# Patient Record
Sex: Female | Born: 1960 | Race: Black or African American | Hispanic: No | Marital: Single | Smoking: Former smoker
Health system: Southern US, Community
[De-identification: ages and names within clinical notes are randomized; demographics above are authoritative.]

## PROBLEM LIST (undated history)

## (undated) DIAGNOSIS — M545 Low back pain, unspecified: Secondary | ICD-10-CM

## (undated) DIAGNOSIS — R0602 Shortness of breath: Secondary | ICD-10-CM

## (undated) DIAGNOSIS — M2550 Pain in unspecified joint: Secondary | ICD-10-CM

## (undated) DIAGNOSIS — R002 Palpitations: Secondary | ICD-10-CM

## (undated) DIAGNOSIS — I4891 Unspecified atrial fibrillation: Secondary | ICD-10-CM

## (undated) DIAGNOSIS — F419 Anxiety disorder, unspecified: Secondary | ICD-10-CM

## (undated) DIAGNOSIS — U071 COVID-19: Secondary | ICD-10-CM

## (undated) DIAGNOSIS — M25569 Pain in unspecified knee: Secondary | ICD-10-CM

## (undated) DIAGNOSIS — Z86711 Personal history of pulmonary embolism: Secondary | ICD-10-CM

## (undated) DIAGNOSIS — Z86718 Personal history of other venous thrombosis and embolism: Secondary | ICD-10-CM

## (undated) DIAGNOSIS — M549 Dorsalgia, unspecified: Secondary | ICD-10-CM

## (undated) DIAGNOSIS — E119 Type 2 diabetes mellitus without complications: Secondary | ICD-10-CM

## (undated) DIAGNOSIS — R079 Chest pain, unspecified: Secondary | ICD-10-CM

## (undated) DIAGNOSIS — R7303 Prediabetes: Secondary | ICD-10-CM

## (undated) DIAGNOSIS — I2699 Other pulmonary embolism without acute cor pulmonale: Secondary | ICD-10-CM

## (undated) HISTORY — DX: Low back pain, unspecified: M54.50

## (undated) HISTORY — DX: Unspecified atrial fibrillation: I48.91

## (undated) HISTORY — DX: Chest pain, unspecified: R07.9

## (undated) HISTORY — DX: Pain in unspecified knee: M25.569

## (undated) HISTORY — DX: Pain in unspecified joint: M25.50

## (undated) HISTORY — DX: Prediabetes: R73.03

## (undated) HISTORY — DX: Type 2 diabetes mellitus without complications: E11.9

## (undated) HISTORY — DX: Other pulmonary embolism without acute cor pulmonale: I26.99

## (undated) HISTORY — PX: ABDOMINAL HYSTERECTOMY: SHX81

## (undated) HISTORY — DX: Dorsalgia, unspecified: M54.9

## (undated) HISTORY — DX: Personal history of other venous thrombosis and embolism: Z86.718

## (undated) HISTORY — DX: COVID-19: U07.1

## (undated) HISTORY — DX: Anxiety disorder, unspecified: F41.9

## (undated) HISTORY — DX: Shortness of breath: R06.02

## (undated) HISTORY — DX: Palpitations: R00.2

## (undated) HISTORY — DX: Personal history of pulmonary embolism: Z86.711

---

## 2019-12-02 ENCOUNTER — Telehealth: Payer: Self-pay | Admitting: Cardiology

## 2019-12-02 NOTE — Telephone Encounter (Signed)
    Robin Landry from medical records calling back, she said she received a request to send medical records, notes and ekg to [place], however she needs authorization from pt. She doesn't have phone number so she asked once pt is in the office tomorrow to call her so she can get authorization and she will send the records to [place].

## 2019-12-03 ENCOUNTER — Other Ambulatory Visit: Payer: Self-pay | Admitting: Cardiology

## 2019-12-03 ENCOUNTER — Other Ambulatory Visit: Payer: Self-pay

## 2019-12-03 ENCOUNTER — Ambulatory Visit (INDEPENDENT_AMBULATORY_CARE_PROVIDER_SITE_OTHER): Payer: 59 | Admitting: Cardiology

## 2019-12-03 ENCOUNTER — Encounter: Payer: Self-pay | Admitting: Cardiology

## 2019-12-03 VITALS — BP 142/82 | HR 77 | Ht 64.5 in | Wt 278.6 lb

## 2019-12-03 DIAGNOSIS — I2699 Other pulmonary embolism without acute cor pulmonale: Secondary | ICD-10-CM | POA: Diagnosis not present

## 2019-12-03 DIAGNOSIS — R0683 Snoring: Secondary | ICD-10-CM

## 2019-12-03 DIAGNOSIS — I480 Paroxysmal atrial fibrillation: Secondary | ICD-10-CM

## 2019-12-03 DIAGNOSIS — R030 Elevated blood-pressure reading, without diagnosis of hypertension: Secondary | ICD-10-CM

## 2019-12-03 DIAGNOSIS — I4891 Unspecified atrial fibrillation: Secondary | ICD-10-CM

## 2019-12-03 NOTE — Patient Instructions (Signed)
Medication Instructions:  Your physician recommends that you continue on your current medications as directed. Please refer to the Current Medication list given to you today.  *If you need a refill on your cardiac medications before your next appointment, please call your pharmacy*   Lab Work: CMET, CBC, TSH today  If you have labs (blood work) drawn today and your tests are completely normal, you will receive your results only by: Marland Kitchen MyChart Message (if you have MyChart) OR . A paper copy in the mail If you have any lab test that is abnormal or we need to change your treatment, we will call you to review the results.   Testing/Procedures: Your physician has recommended that you have a sleep study. This test records several body functions during sleep, including: brain activity, eye movement, oxygen and carbon dioxide blood levels, heart rate and rhythm, breathing rate and rhythm, the flow of air through your mouth and nose, snoring, body muscle movements, and chest and belly movement.  Follow-Up: At [facility], you and your health needs are our priority.  As part of our continuing mission to provide you with exceptional heart care, we have created designated Provider Care Teams.  These Care Teams include your primary Cardiologist (physician) and Advanced Practice Providers (APPs -  Physician Assistants and Nurse Practitioners) who all work together to provide you with the care you need, when you need it.  We recommend signing up for the patient portal called "MyChart".  Sign up information is provided on this After Visit Summary.  MyChart is used to connect with patients for Virtual Visits (Telemedicine).  Patients are able to view lab/test results, encounter notes, upcoming appointments, etc.  Non-urgent messages can be sent to your provider as well.   To learn more about what you can do with MyChart, go to [email or web address].    Your next appointment:   6 month(s)  The format  for your next appointment:   In Person  Provider:   Epifanio Lesches, MD   Other Instructions Please check your blood pressure at home daily, write it down.  Call the office or send message via Mychart with the readings in 2 weeks for Dr. Bjorn Pippin to review.

## 2019-12-03 NOTE — Telephone Encounter (Signed)
Patient has new patient appointment to day with Dr. Bjorn Pippin, referred by PCP, Dr. Wynelle Link.  Records in proficient and records received from [facility] (hospital records).

## 2019-12-03 NOTE — Progress Notes (Signed)
Cardiology Office Note:    Date:  12/03/2019   ID:  Robin Landry, DOB [birth year], MRN [id number]  PCP:  Deatra James, MD  Cardiologist:  No primary care provider on file.  Electrophysiologist:  None   Referring MD: Deatra James, MD   Chief Complaint  Patient presents with   Atrial Fibrillation    History of Present Illness:    Robin Landry is a 59 y.o. female with a hx of morbid obesity, diabetes, PE after COVID-19 infection September 2021, atrial fibrillation who is referred by Dr. Wynelle Link for evaluation of atrial fibrillation.  She was hospitalized at [facility] in [place] 10/2019.  She had COVID-19 infection complicated by pulmonary embolism and atrial fibrillation.  CTPA showed right and left lower lobe segmental PEs.  She was started on diltiazem 240 mg daily and Eliquis 5 mg twice daily.  She had an episode of atrial fibrillation with RVR during admission, rate 118.  Was started on diltiazem drip and eventually switched to oral diltiazem.  Echocardiogram on 11/03/2019 showed EF 60-65%, normal RV size/function, no significant valvular disease.  Since discharge from the hospital, she reports she is feeling better.  She denies any chest pain or dyspnea.  Denies any lightheadedness, syncope, palpitations, or lower extremity edema.  Reports she does not exercise.  Does not check BP at home.  She smoked 0.5 pack/day x 15 years, quit at age 65.  No history of heart disease in her immediate family   No past medical history on file.    Current Medications: Current Meds  Medication Sig   diltiazem (CARDIZEM CD) 240 MG 24 hr capsule Take 240 mg by mouth daily.   ELIQUIS 5 MG TABS tablet Take 5 mg by mouth 2 (two) times daily.   metFORMIN (GLUCOPHAGE) 500 MG tablet Take 500 mg by mouth 2 (two) times daily.     Allergies:   Patient has no allergy information on record.   Social History   Socioeconomic History   Marital status: Single    Spouse name: Not on file    Number of children: Not on file   Years of education: Not on file   Highest education level: Not on file  Occupational History   Not on file  Tobacco Use   Smoking status: Former Smoker   Smokeless tobacco: Never Used  Substance and Sexual Activity   Alcohol use: Not on file   Drug use: Not on file   Sexual activity: Not on file  Other Topics Concern   Not on file  Social History Narrative   Not on file   Social Determinants of Health   Financial Resource Strain:    Difficulty of Paying Living Expenses: Not on file  Food Insecurity:    Worried About Running Out of Food in the Last Year: Not on file   Ran Out of Food in the Last Year: Not on file  Transportation Needs:    Lack of Transportation (Medical): Not on file   Lack of Transportation (Non-Medical): Not on file  Physical Activity:    Days of Exercise per Week: Not on file   Minutes of Exercise per Session: Not on file  Stress:    Feeling of Stress : Not on file  Social Connections:    Frequency of Communication with Friends and Family: Not on file   Frequency of Social Gatherings with Friends and Family: Not on file   Attends Religious Services: Not on file   Active Member  of Clubs or Organizations: Not on file   Attends Banker Meetings: Not on file   Marital Status: Not on file     Family History: No history of heart disease in her immediate family  ROS:   Please see the history of present illness.     All other systems reviewed and are negative.  EKGs/Labs/Other Studies Reviewed:    The following studies were reviewed today:   EKG:  EKG is  ordered today.  The ekg ordered today demonstrates normal sinus rhythm, rate 77, low voltage, nonspecific diffuse T wave flattening  Recent Labs: No results found for requested labs within last 8760 hours.  Recent Lipid Panel No results found for: CHOL, TRIG, HDL, CHOLHDL, VLDL, LDLCALC, LDLDIRECT  Physical Exam:    VS:  BP  (!) 142/82    Pulse 77    Ht 5' 4.5" (1.638 m)    Wt 278 lb 9.6 oz (126.4 kg)    SpO2 95%    BMI 47.08 kg/m     Wt Readings from Last 3 Encounters:  12/03/19 278 lb 9.6 oz (126.4 kg)     GEN:  in no acute distress HEENT: Normal NECK: No JVD; No carotid bruits LYMPHATICS: No lymphadenopathy CARDIAC:RRR, no murmurs, rubs, gallops RESPIRATORY:  Clear to auscultation without rales, wheezing or rhonchi  ABDOMEN: Soft, non-tender, non-distended MUSCULOSKELETAL:  No edema; No deformity  SKIN: Warm and dry NEUROLOGIC:  Alert and oriented x 3 PSYCHIATRIC:  Normal affect   ASSESSMENT:    1. Paroxysmal atrial fibrillation (HCC)   2. Acute pulmonary embolism without acute cor pulmonale, unspecified pulmonary embolism type (HCC)   3. Elevated BP without diagnosis of hypertension   4. Snoring    PLAN:    Paroxysmal atrial fibrillation: Diagnosed 10/2019 during admission for acute PE/COVID-19.  CHA2DS2-VASc score 2 (diabetes, female).  Echocardiogram on 11/03/2019 showed EF 60-65%, normal RV size/function, no significant valvular disease. -Continue Eliquis 5 mg twice daily -Continue diltiazem 240 mg daily -Sleep study -Check TSH, CMET, CBC  Acute pulmonary embolism: Diagnosed 10/2019.  No evidence of right heart strain on echocardiogram -Continue Eliquis 5 mg twice daily  T2DM: On Metformin  Elevated BP: Mildly elevated in clinic today, no history of hypertension.  On diltiazem for A. fib as above.  Asked patient to check BP twice daily for next week and call with results  Snoring: Concern for OSA, will check sleep study  RTC in 6 months  Medication Adjustments/Labs and Tests Ordered: Current medicines are reviewed at length with the patient today.  Concerns regarding medicines are outlined above.  Orders Placed This Encounter  Procedures   Comprehensive metabolic panel   CBC   TSH   EKG 12-Lead   Split night study   No orders of the defined types were placed in this  encounter.   Patient Instructions  Medication Instructions:  Your physician recommends that you continue on your current medications as directed. Please refer to the Current Medication list given to you today.  *If you need a refill on your cardiac medications before your next appointment, please call your pharmacy*   Lab Work: CMET, CBC, TSH today  If you have labs (blood work) drawn today and your tests are completely normal, you will receive your results only by:  MyChart Message (if you have MyChart) OR  A paper copy in the mail If you have any lab test that is abnormal or we need to change your treatment, we will call  you to review the results.   Testing/Procedures: Your physician has recommended that you have a sleep study. This test records several body functions during sleep, including: brain activity, eye movement, oxygen and carbon dioxide blood levels, heart rate and rhythm, breathing rate and rhythm, the flow of air through your mouth and nose, snoring, body muscle movements, and chest and belly movement.  Follow-Up: At [facility], you and your health needs are our priority.  As part of our continuing mission to provide you with exceptional heart care, we have created designated Provider Care Teams.  These Care Teams include your primary Cardiologist (physician) and Advanced Practice Providers (APPs -  Physician Assistants and Nurse Practitioners) who all work together to provide you with the care you need, when you need it.  We recommend signing up for the patient portal called "MyChart".  Sign up information is provided on this After Visit Summary.  MyChart is used to connect with patients for Virtual Visits (Telemedicine).  Patients are able to view lab/test results, encounter notes, upcoming appointments, etc.  Non-urgent messages can be sent to your provider as well.   To learn more about what you can do with MyChart, go to [email or web address].    Your next  appointment:   6 month(s)  The format for your next appointment:   In Person  Provider:   Epifanio Lesches, MD   Other Instructions Please check your blood pressure at home daily, write it down.  Call the office or send message via Mychart with the readings in 2 weeks for Dr. Bjorn Pippin to review.       Signed, Little Ishikawa, MD  12/03/2019 5:15 PM    [facility]

## 2019-12-04 ENCOUNTER — Telehealth: Payer: Self-pay | Admitting: *Deleted

## 2019-12-04 ENCOUNTER — Telehealth: Payer: Self-pay | Admitting: Cardiology

## 2019-12-04 MED ORDER — BLOOD PRESSURE CUFF MISC: 1.0000 [IU] | Freq: Once | 0 refills | Status: AC

## 2019-12-04 NOTE — Telephone Encounter (Signed)
PA request for HST submitted to [facility] via fax to [phone].

## 2019-12-04 NOTE — Telephone Encounter (Signed)
Spoke to patient she stated she needs a prescription to buy a B/P monitor.Stated her insurance will help pay if she has a prescription.Advised I will send message to Dr.Schumann's RN.

## 2019-12-04 NOTE — Telephone Encounter (Signed)
Patient wanted to know if Dr. Jerene Pitch could write her an rx for a BP machine. The patient uses [id number] - [place], [place] - [address] as her pharmacy

## 2019-12-04 NOTE — Telephone Encounter (Signed)
Left detailed message (ok per DPR)-rx sent electronically.  If this does not work, advised to call back and can print rx

## 2019-12-05 ENCOUNTER — Telehealth: Payer: Self-pay | Admitting: *Deleted

## 2019-12-05 ENCOUNTER — Telehealth: Payer: Self-pay | Admitting: Cardiology

## 2019-12-05 LAB — COMPREHENSIVE METABOLIC PANEL
ALT: 13 IU/L (ref 0–32)
AST: 17 IU/L (ref 0–40)
Albumin/Globulin Ratio: 1.1 — ABNORMAL LOW (ref 1.2–2.2)
Albumin: 4 g/dL (ref 3.8–4.9)
Alkaline Phosphatase: 100 IU/L (ref 44–121)
BUN/Creatinine Ratio: 15 (ref 9–23)
BUN: 13 mg/dL (ref 6–24)
Bilirubin Total: 0.3 mg/dL (ref 0.0–1.2)
CO2: 25 mmol/L (ref 20–29)
Calcium: 9.9 mg/dL (ref 8.7–10.2)
Chloride: 101 mmol/L (ref 96–106)
Creatinine, Ser: 0.86 mg/dL (ref 0.57–1.00)
GFR calc Af Amer: 87 mL/min/{1.73_m2} (ref >59)
GFR calc non Af Amer: 75 mL/min/{1.73_m2} (ref >59)
Globulin, Total: 3.6 g/dL (ref 1.5–4.5)
Glucose: 99 mg/dL (ref 65–99)
Potassium: 4.3 mmol/L (ref 3.5–5.2)
Sodium: 139 mmol/L (ref 134–144)
Total Protein: 7.6 g/dL (ref 6.0–8.5)

## 2019-12-05 LAB — CBC
Hematocrit: 36.7 % (ref 34.0–46.6)
Hemoglobin: 11.4 g/dL (ref 11.1–15.9)
MCH: 25.9 pg — ABNORMAL LOW (ref 26.6–33.0)
MCHC: 31.1 g/dL — ABNORMAL LOW (ref 31.5–35.7)
MCV: 83 fL (ref 79–97)
Platelets: 315 10*3/uL (ref 150–450)
RBC: 4.41 x10E6/uL (ref 3.77–5.28)
RDW: 15.7 % — ABNORMAL HIGH (ref 11.7–15.4)
WBC: 7.6 10*3/uL (ref 3.4–10.8)

## 2019-12-05 LAB — TSH: TSH: 1.54 u[IU]/mL (ref 0.450–4.500)

## 2019-12-05 NOTE — Telephone Encounter (Signed)
Returned call to patient, discussed lab results and patient verbalized understanding.

## 2019-12-05 NOTE — Telephone Encounter (Signed)
Patient is requesting to discuss results from lab work completed on 12/04/19. Please call.

## 2019-12-05 NOTE — Telephone Encounter (Signed)
Patient notified of HST appointment details. °

## 2019-12-17 ENCOUNTER — Ambulatory Visit: Payer: Self-pay | Admitting: Cardiovascular Disease

## 2019-12-18 ENCOUNTER — Encounter: Payer: Self-pay | Admitting: Internal Medicine

## 2019-12-18 ENCOUNTER — Other Ambulatory Visit: Payer: Self-pay

## 2019-12-18 ENCOUNTER — Ambulatory Visit (INDEPENDENT_AMBULATORY_CARE_PROVIDER_SITE_OTHER): Payer: 59 | Admitting: Internal Medicine

## 2019-12-18 ENCOUNTER — Institutional Professional Consult (permissible substitution): Payer: Self-pay | Admitting: Internal Medicine

## 2019-12-18 VITALS — BP 110/76 | HR 91 | Temp 97.1°F | Ht 64.5 in | Wt 275.0 lb

## 2019-12-18 DIAGNOSIS — U071 COVID-19: Secondary | ICD-10-CM | POA: Diagnosis not present

## 2019-12-18 DIAGNOSIS — I2699 Other pulmonary embolism without acute cor pulmonale: Secondary | ICD-10-CM | POA: Diagnosis not present

## 2019-12-18 NOTE — Progress Notes (Signed)
Robin Landry    [id number]    [birth year]  Primary Care Physician:Sun, Charise Carwin, MD  Referring Physician: Deatra James, MD [phone] [address] [place],  [place] [address] Reason for Consultation: covid follow up Date of Consultation: 12/18/2019  Chief complaint:   Chief Complaint  Patient presents with  . Consult    covid in August, hospitalized with blood clots and afib.     HPI: The patient is a 59 year old woman with an undiscovered past medical history who presents for follow-up after being hospitalized with presumed Covid infection.  She never had a positive test. But was presumed to have covid due to whole family contracting it from a nephew from school.  She was found to have acute bilateral pulmonary emboli as well as paroxysmal atrial fibrillation.  She is started on Eliquis and diltiazem.  She notes since discharge some mild ongoing shortness of breath.  She does have a pulse ox monitor at home and her levels are consistently 96% and above.  She was 98% today.  She denies any ongoing chest tightness or wheezing.  At the time of her hospital stay she did have some mild back pain bilaterally.No fevers chills night sweats or weight loss.  No cough.  She is here for follow-up today regarding duration of anticoagulation and her lungs healing from Covid.  Social history:  Social History   Occupational History  . Not on file  Tobacco Use  . Smoking status: Former Smoker    Packs/day: 0.50    Years: 15.00    Pack years: 7.50    Types: Cigarettes    Quit date: 12/17/1993    Years since quitting: 26.0  . Smokeless tobacco: Never Used  Substance and Sexual Activity  . Alcohol use: Not on file  . Drug use: Not on file  . Sexual activity: Not on file    Relevant family history:  Family History  Problem Relation Age of Onset  . Venous thrombosis Neg Hx     Past Medical History:  Diagnosis Date  . COVID-19    August, hospitalized  . History of  pulmonary embolus (PE)     History reviewed. No pertinent surgical history.   Physical Exam: Blood pressure 110/76, pulse 91, temperature (!) 97.1 F (36.2 C), temperature source Temporal, height 5' 4.5" (1.638 m), weight 275 lb (124.7 kg), SpO2 99 %. Gen:      No acute distress ENT:  no nasal polyps, mucus membranes moist Lungs:    No increased respiratory effort, symmetric chest wall excursion, clear to auscultation bilaterally, no wheezes or crackles CV:         Regular rate and rhythm; no murmurs, rubs, or gallops.  No pedal edema Abd:      Obese, + bowel sounds; soft, non-tender; no distension MSK: no acute synovitis of DIP or PIP joints, no mechanics hands.  Skin:      Warm and dry; no rashes Neuro: normal speech, no focal facial asymmetry Psych: alert and oriented x3, normal mood and affect   Data Reviewed/Medical Decision Making:  Independent interpretation of tests: Imaging: .none available for review. Reports showed subsegmental PE.    PFTs: None on file.   Labs:  Lab Results  Component Value Date   WBC 7.6 12/04/2019   HGB 11.4 12/04/2019   HCT 36.7 12/04/2019   MCV 83 12/04/2019   PLT 315 12/04/2019   Lab Results  Component Value Date  NA 139 12/04/2019   K 4.3 12/04/2019   CL 101 12/04/2019   CO2 25 12/04/2019     Immunization status:   There is no immunization history on file for this patient.  . I reviewed prior external note(s) from cardiology, [facility] Dr. Wynelle Link . I reviewed the result(s) of the labs and imaging as noted above.   Assessment:  COVID 19 Infection Acute pulmonary embolism, provoked secondary to above Paroxysmal Atrial Fibrillation Snoring  Plan/Recommendations: Ms. Lollie Sails presents for follow-up after hospitalization for COVID-19 infection.  She should have at least 3 months of anticoagulation for a provoked PE - duration through December 2021. However if she needs additional anticoagulation for persistent atrial  fibrillation, that should be addressed by her cardiologist Dr. Bjorn Pippin. She is rate controlled today so it's possible this was secondary to acute illness.  Agree with sleep study to thoroughly evaluate her A. fib, especially in the setting of obesity and hypertension.  Recommend vaccination against covid 19 90 days after her positive test - again in December 2021.   She says she is going to [place] and would like to bring up copies of her CT scan, I am happy to review these.  We discussed disease management and progression at length today.  We discussed how her symptoms should continue to improve.  Should she not have improvement in her dyspnea on exertion she can come back and we can discuss PFTs at that time.  I spent 45 minutes in the care of this patient today including pre-charting, chart review, review of results, face-to-face care, coordination of care and communication with consultants etc.).  Return to Care: Return if symptoms worsen or fail to improve, for shortness of breath.  Durel Salts, MD Pulmonary and Critical Care Medicine [facility] Office:[phone]  CC: Deatra James, MD

## 2019-12-18 NOTE — Patient Instructions (Addendum)
Recommend vaccination against covid-19 90 days after your positive test or onset of symptoms - again in December 2021.   Recommend taking eliquis at least through December 2021 for your blood clots.   Follow up with the heart doctor about your sleep study, atrial fibrillation. They may want you to stay on Eliquis longer if you are still in A. Fib.  Can obtain images - the disc of your CT scan of your chest from the hospital in [facility]. I am happy to review.

## 2020-01-07 ENCOUNTER — Other Ambulatory Visit: Payer: Self-pay

## 2020-01-07 ENCOUNTER — Ambulatory Visit (HOSPITAL_BASED_OUTPATIENT_CLINIC_OR_DEPARTMENT_OTHER): Payer: 59 | Attending: Cardiology | Admitting: Cardiovascular Disease

## 2020-01-07 DIAGNOSIS — Z7984 Long term (current) use of oral hypoglycemic drugs: Secondary | ICD-10-CM | POA: Insufficient documentation

## 2020-01-07 DIAGNOSIS — Z7901 Long term (current) use of anticoagulants: Secondary | ICD-10-CM | POA: Diagnosis not present

## 2020-01-07 DIAGNOSIS — G4733 Obstructive sleep apnea (adult) (pediatric): Secondary | ICD-10-CM | POA: Diagnosis not present

## 2020-01-07 DIAGNOSIS — Z79899 Other long term (current) drug therapy: Secondary | ICD-10-CM | POA: Insufficient documentation

## 2020-01-07 DIAGNOSIS — G4736 Sleep related hypoventilation in conditions classified elsewhere: Secondary | ICD-10-CM | POA: Insufficient documentation

## 2020-01-07 DIAGNOSIS — R0683 Snoring: Secondary | ICD-10-CM

## 2020-01-07 DIAGNOSIS — I4891 Unspecified atrial fibrillation: Secondary | ICD-10-CM | POA: Insufficient documentation

## 2020-01-18 ENCOUNTER — Encounter (HOSPITAL_BASED_OUTPATIENT_CLINIC_OR_DEPARTMENT_OTHER): Payer: Self-pay | Admitting: Cardiovascular Disease

## 2020-01-18 NOTE — Procedures (Signed)
     Patient Name: Robin Landry, Robin Landry Date: 01/07/2020 Gender: Female D.O.B: 01-19-1961 Age (years): 59 Referring Provider: Epifanio Lesches Height (inches): 64 Interpreting Physician: Nicki Guadalajara MD, ABSM Weight (lbs): 270 RPSGT: Ruthton Sink BMI: 46 MRN: [id number] Neck Size: 16.50  CLINICAL INFORMATION Sleep Study Type: HST  Indication for sleep study: snoring, atrial fibrillation,   Epworth Sleepiness Score: 6  SLEEP STUDY TECHNIQUE A multi-channel overnight portable sleep study was performed. The channels recorded were: nasal airflow, thoracic respiratory movement, and oxygen saturation with a pulse oximetry. Snoring was also monitored.  MEDICATIONS diltiazem (CARDIZEM CD) 240 MG 24 hr capsule ELIQUIS 5 MG TABS tablet Lancets (ONETOUCH DELICA PLUS LANCET33G) MISC metFORMIN (GLUCOPHAGE) 500 MG tablet Patient self administered medications include: N/A.  SLEEP ARCHITECTURE Patient was studied for 368 minutes. The sleep efficiency was 99.5 % and the patient was supine for 59%. The arousal index was 0.0 per hour.  RESPIRATORY PARAMETERS The overall AHI was 67.3 per hour, with a central apnea index of 0.0 per hour.  The oxygen nadir was 80% during sleep.  CARDIAC DATA Mean heart rate during sleep was 61.8 bpm.  IMPRESSIONS - Severe obstructive sleep apnea occurred during this study (AHI 67.3/h). There was a significant positional component with supine sleep AHI 79.4/h versus non-supine sleep AHI 50.07/h. - No significant central sleep apnea occurred during this study (CAI 0.0/h). - Severe oxygen desaturation to a nadir of 80%. Time spent < 89% was 26.1 minutes. - Patient snored 30.3% during the sleep.  DIAGNOSIS - Obstructive Sleep Apnea (G47.33) - Nocturnal Hypoxemia (G47.36)  RECOMMENDATIONS - In this patient with cardiovascular co-morbidities and severe sleep apnea recommend an in-lab CPAP titration to optimally evaluate and treat his severe  sleep disordered breathing. - Effort should be made to optimize nasal and oropharyngeal patency.  - The patient should be counseled to avoid supine sleep; consider positional therapy avoiding supine position during sleep. - Avoid alcohol, sedatives and other CNS depressants that may worsen sleep apnea and disrupt normal sleep architecture. - Sleep hygiene should be reviewed to assess factors that may improve sleep quality. - Weight management (BMI 46) and regular exercise should be initiated or continued. - Recommend a download  after CPAP initiation and sleep clinic evaluation.    [Electronically signed] 01/18/2020 08:05 PM  Nicki Guadalajara MD, [facility], ABSM Diplomate, American Board of Sleep Medicine   NPI: [id number] [facility] PH: [phone]   FX: [phone] ACCREDITED BY THE AMERICAN ACADEMY OF SLEEP MEDICINE

## 2020-01-19 ENCOUNTER — Telehealth: Payer: Self-pay | Admitting: Cardiology

## 2020-01-19 DIAGNOSIS — G4733 Obstructive sleep apnea (adult) (pediatric): Secondary | ICD-10-CM

## 2020-01-19 NOTE — Telephone Encounter (Signed)
Will forward to sleep coordinator. 

## 2020-01-19 NOTE — Telephone Encounter (Signed)
Patient is requesting a call back to review sleep study completed on 01/07/20. Please call.

## 2020-01-19 NOTE — Telephone Encounter (Signed)
Informed patient of sleep study results and patient understanding was verbalized. Patient understands her sleep study showed   IMPRESSIONS - Severe obstructive sleep apnea occurred during this study (AHI 67.3/h). There was a significant positional component with supine sleep AHI 79.4/h versus non-supine sleep AHI 50.07/h. - Severe oxygen desaturation to a nadir of 80%. Time spent < 89% was 26.1 minutes. - Patient snored 30.3% during the sleep.  DIAGNOSIS - Obstructive Sleep Apnea (G47.33) - Nocturnal Hypoxemia (G47.36)  RECOMMENDATIONS - In this patient with cardiovascular co-morbidities and severe sleep apnea recommend an in-lab CPAP titration to optimally evaluate and treat his severe sleep disordered breathing.  Titration sent to sleep pool

## 2020-01-21 ENCOUNTER — Telehealth: Payer: Self-pay | Admitting: Cardiovascular Disease

## 2020-01-21 NOTE — Telephone Encounter (Signed)
PA pending for cpap titration - clinical notes faxed to [facility].

## 2020-03-08 ENCOUNTER — Ambulatory Visit (HOSPITAL_BASED_OUTPATIENT_CLINIC_OR_DEPARTMENT_OTHER): Payer: 59 | Admitting: Cardiovascular Disease

## 2020-03-12 ENCOUNTER — Telehealth: Payer: Self-pay | Admitting: Cardiology

## 2020-03-12 MED ORDER — ELIQUIS 5 MG PO TABS
5.0000 mg | ORAL_TABLET | Freq: Two times a day (BID) | ORAL | 0 refills | Status: DC
Start: 2020-03-12 — End: 2020-11-17

## 2020-03-12 NOTE — Telephone Encounter (Signed)
NOTIFIED PATIENT 2 WEEKS WORTH SAMPLES ARE AVAILABLE FOR PICK UP   PATIENT VERBALIZED UNDERSTANDING AND WILL COME TO OFFICE

## 2020-03-12 NOTE — Telephone Encounter (Signed)
Patient calling the office for samples of medication:   1.  What medication and dosage are you requesting samples for? Eliquis  2.  Are you currently out of this medication? She is completely out- her new insurance will not kick in until 03-30-20- she is out of work

## 2020-03-18 ENCOUNTER — Telehealth: Payer: Self-pay | Admitting: Cardiology

## 2020-03-18 MED ORDER — DILTIAZEM HCL ER COATED BEADS 240 MG PO CP24: 240.0000 mg | ORAL_CAPSULE | Freq: Every day | ORAL | 4 refills | Status: DC

## 2020-03-18 NOTE — Telephone Encounter (Signed)
Spoke to patient . Medication  E- sent to pharamcy  patient requested monthly  Prescription -  30 day  - one tablet daily  refills x4   Patient is aware

## 2020-03-18 NOTE — Telephone Encounter (Signed)
° ° ° °*  STAT* If patient is at the pharmacy, call can be transferred to refill team.   1. Which medications need to be refilled? (please list name of each medication and dose if known) diltiazem (CARDIZEM CD) 240 MG 24 hr capsule  2. Which pharmacy/location (including street and city if local pharmacy) is medication to be sent to? [id number] - [place], [place] - [address]  3. Do they need a 30 day or 90 day supply? 60 days    Pt said she is running errands right now and if she can get this today

## 2020-03-18 NOTE — Telephone Encounter (Signed)
Patient following up. States she has been out for a week

## 2020-05-10 ENCOUNTER — Other Ambulatory Visit: Payer: Self-pay

## 2020-05-10 ENCOUNTER — Ambulatory Visit (INDEPENDENT_AMBULATORY_CARE_PROVIDER_SITE_OTHER): Payer: 59 | Admitting: Cardiology

## 2020-05-10 VITALS — BP 126/60 | HR 67 | Ht 64.0 in | Wt 271.6 lb

## 2020-05-10 DIAGNOSIS — R030 Elevated blood-pressure reading, without diagnosis of hypertension: Secondary | ICD-10-CM | POA: Diagnosis not present

## 2020-05-10 DIAGNOSIS — G4733 Obstructive sleep apnea (adult) (pediatric): Secondary | ICD-10-CM

## 2020-05-10 DIAGNOSIS — I4891 Unspecified atrial fibrillation: Secondary | ICD-10-CM | POA: Diagnosis not present

## 2020-05-10 DIAGNOSIS — I480 Paroxysmal atrial fibrillation: Secondary | ICD-10-CM | POA: Diagnosis not present

## 2020-05-10 DIAGNOSIS — I2699 Other pulmonary embolism without acute cor pulmonale: Secondary | ICD-10-CM | POA: Diagnosis not present

## 2020-05-10 NOTE — Patient Instructions (Signed)
Medication Instructions:  Your physician recommends that you continue on your current medications as directed. Please refer to the Current Medication list given to you today.  Testing/Procedures: Reschedule CPAP titration  Follow-Up: At [facility], you and your health needs are our priority.  As part of our continuing mission to provide you with exceptional heart care, we have created designated Provider Care Teams.  These Care Teams include your primary Cardiologist (physician) and Advanced Practice Providers (APPs -  Physician Assistants and Nurse Practitioners) who all work together to provide you with the care you need, when you need it.  We recommend signing up for the patient portal called "MyChart".  Sign up information is provided on this After Visit Summary.  MyChart is used to connect with patients for Virtual Visits (Telemedicine).  Patients are able to view lab/test results, encounter notes, upcoming appointments, etc.  Non-urgent messages can be sent to your provider as well.   To learn more about what you can do with MyChart, go to [email or web address].    Your next appointment:   6 month(s)  The format for your next appointment:   In Person  Provider:   Epifanio Lesches, MD

## 2020-05-10 NOTE — Progress Notes (Signed)
Cardiology Office Note:    Date:  05/10/2020   ID:  Robin Landry, DOB October 06, 1960, MRN [id number]  PCP:  Robin James, MD  Cardiologist:  No primary care provider on file.  Electrophysiologist:  None   Referring MD: Robin James, MD   Chief Complaint  Patient presents with  . Atrial Fibrillation    History of Present Illness:    Robin Landry is a 60 y.o. female with a hx of morbid obesity, diabetes, PE after COVID-19 infection September 2021, atrial fibrillation who presents for follow-up.  She was referred by Dr. Wynelle Link for evaluation of atrial fibrillation, initially seen on 12/03/2019.  She was hospitalized at [facility] in [place] 10/2019.  She had COVID-19 infection complicated by pulmonary embolism and atrial fibrillation.  CTPA showed right and left lower lobe segmental PEs.  She was started on diltiazem 240 mg daily and Eliquis 5 mg twice daily.  She had an episode of atrial fibrillation with RVR during admission, rate 118.  Was started on diltiazem drip and eventually switched to oral diltiazem.  Echocardiogram on 11/03/2019 showed EF 60-65%, normal RV size/function, no significant valvular disease.  She smoked 0.5 pack/day x 15 years, quit at age 60.  No history of heart disease in her immediate family  Since last clinic visit, she reports that she is doing well.  She denies any palpitations.  Denies any chest pain.  Does report occasional shortness of breath.  She has been walking but not every day.  She denies any lightheadedness, syncope, lower extremity edema.  Reports he is taking Eliquis, denies any bleeding issues.  Reports BP has been 120s over 60s at home.    Past Medical History:  Diagnosis Date  . COVID-19    August, hospitalized  . History of pulmonary embolus (PE)       Current Medications: Current Meds  Medication Sig  . diltiazem (CARDIZEM CD) 240 MG 24 hr capsule Take 1 capsule (240 mg total) by mouth daily.  Marland Kitchen ELIQUIS 5 MG TABS tablet Take 1  tablet (5 mg total) by mouth 2 (two) times daily.  . Lancets (ONETOUCH DELICA PLUS LANCET33G) MISC Apply 1 each topically daily.  Letta Pate ULTRA test strip 1 each daily.  . [DISCONTINUED] metFORMIN (GLUCOPHAGE) 500 MG tablet Take 500 mg by mouth 2 (two) times daily.     Allergies:   Patient has no allergy information on record.   Social History   Socioeconomic History  . Marital status: Single    Spouse name: Not on file  . Number of children: Not on file  . Years of education: Not on file  . Highest education level: Not on file  Occupational History  . Not on file  Tobacco Use  . Smoking status: Former Smoker    Packs/day: 0.50    Years: 15.00    Pack years: 7.50    Types: Cigarettes    Quit date: 12/17/1993    Years since quitting: 26.4  . Smokeless tobacco: Never Used  Substance and Sexual Activity  . Alcohol use: Not on file  . Drug use: Not on file  . Sexual activity: Not on file  Other Topics Concern  . Not on file  Social History Narrative  . Not on file   Social Determinants of Health   Financial Resource Strain: Not on file  Food Insecurity: Not on file  Transportation Needs: Not on file  Physical Activity: Not on file  Stress: Not on file  Social Connections: Not on file     Family History: No history of heart disease in her immediate family  ROS:   Please see the history of present illness.     All other systems reviewed and are negative.  EKGs/Labs/Other Studies Reviewed:    The following studies were reviewed today:   EKG:  EKG is ordered today.  The ekg ordered today demonstrates normal sinus rhythm, rate 67, nonspecific T wave flattening   Recent Labs: 12/04/2019: ALT 13; BUN 13; Creatinine, Ser 0.86; Hemoglobin 11.4; Platelets 315; Potassium 4.3; Sodium 139; TSH 1.540  Recent Lipid Panel No results found for: CHOL, TRIG, HDL, CHOLHDL, VLDL, LDLCALC, LDLDIRECT  Physical Exam:    VS:  BP 126/60   Pulse 67   Ht 5\' 4"  (1.626 m)   Wt  271 lb 9.6 oz (123.2 kg)   BMI 46.62 kg/m     Wt Readings from Last 3 Encounters:  05/10/20 271 lb 9.6 oz (123.2 kg)  01/07/20 270 lb (122.5 kg)  12/18/19 275 lb (124.7 kg)     GEN:  in no acute distress HEENT: Normal NECK: No JVD; No carotid bruits CARDIAC:RRR, no murmurs, rubs, gallops RESPIRATORY:  Clear to auscultation without rales, wheezing or rhonchi  ABDOMEN: Soft, non-tender, non-distended MUSCULOSKELETAL:  No edema; No deformity  SKIN: Warm and dry NEUROLOGIC:  Alert and oriented x 3 PSYCHIATRIC:  Normal affect   ASSESSMENT:    1. Paroxysmal atrial fibrillation (HCC)   2. Acute pulmonary embolism without acute cor pulmonale, unspecified pulmonary embolism type (HCC)   3. Atrial fibrillation, unspecified type (HCC)   4. Elevated BP without diagnosis of hypertension   5. OSA (obstructive sleep apnea)    PLAN:    Paroxysmal atrial fibrillation: Diagnosed 10/2019 during admission for acute PE/COVID-19.  CHA2DS2-VASc score 2 (diabetes, female).  Echocardiogram on 11/03/2019 showed EF 60-65%, normal RV size/function, no significant valvular disease. -Continue Eliquis 5 mg twice daily -Continue diltiazem 240 mg daily -Sleep study showed severe OSA, starting on CPAP  Acute pulmonary embolism: Diagnosed 10/2019.  No evidence of right heart strain on echocardiogram -Continue Eliquis 5 mg twice daily  T2DM: Currently diet controlled, A1c 6.3% on 12/29/2019  Elevated BP: Mildly elevated in prior clinic appointment, improved today.  Continue diltiazem  OSA: Sleep study 01/07/2020 showed severe OSA, starting on CPAP.  Reports she declined to start CPAP but discussed importance and she is willing to proceed  RTC in 6 months  Medication Adjustments/Labs and Tests Ordered: Current medicines are reviewed at length with the patient today.  Concerns regarding medicines are outlined above.  Orders Placed This Encounter  Procedures  . EKG 12-Lead   No orders of the defined types  were placed in this encounter.   Patient Instructions  Medication Instructions:  Your physician recommends that you continue on your current medications as directed. Please refer to the Current Medication list given to you today.  Testing/Procedures: Reschedule CPAP titration  Follow-Up: At [facility], you and your health needs are our priority.  As part of our continuing mission to provide you with exceptional heart care, we have created designated Provider Care Teams.  These Care Teams include your primary Cardiologist (physician) and Advanced Practice Providers (APPs -  Physician Assistants and Nurse Practitioners) who all work together to provide you with the care you need, when you need it.  We recommend signing up for the patient portal called "MyChart".  Sign up information is provided on this After Visit Summary.  MyChart is used to connect with patients for Virtual Visits (Telemedicine).  Patients are able to view lab/test results, encounter notes, upcoming appointments, etc.  Non-urgent messages can be sent to your provider as well.   To learn more about what you can do with MyChart, go to [email or web address].    Your next appointment:   6 month(s)  The format for your next appointment:   In Person  Provider:   Epifanio Lesches, MD       Signed, Little Ishikawa, MD  05/10/2020 11:33 AM     [facility]

## 2020-05-12 ENCOUNTER — Telehealth: Payer: Self-pay | Admitting: *Deleted

## 2020-05-12 NOTE — Telephone Encounter (Signed)
PA request for CPAP titration submitted to [facility] via fax.

## 2020-05-13 ENCOUNTER — Telehealth: Payer: Self-pay | Admitting: *Deleted

## 2020-05-13 NOTE — Telephone Encounter (Signed)
CPAP appointment details left on patient's voicemail.

## 2020-07-07 ENCOUNTER — Encounter (HOSPITAL_BASED_OUTPATIENT_CLINIC_OR_DEPARTMENT_OTHER): Payer: Self-pay | Admitting: Cardiovascular Disease

## 2020-07-07 ENCOUNTER — Ambulatory Visit (HOSPITAL_BASED_OUTPATIENT_CLINIC_OR_DEPARTMENT_OTHER): Payer: 59 | Attending: Cardiology | Admitting: Cardiovascular Disease

## 2020-07-07 ENCOUNTER — Other Ambulatory Visit: Payer: Self-pay

## 2020-07-07 DIAGNOSIS — G4733 Obstructive sleep apnea (adult) (pediatric): Secondary | ICD-10-CM

## 2020-07-07 DIAGNOSIS — Z79899 Other long term (current) drug therapy: Secondary | ICD-10-CM | POA: Insufficient documentation

## 2020-07-07 DIAGNOSIS — Z7901 Long term (current) use of anticoagulants: Secondary | ICD-10-CM | POA: Diagnosis not present

## 2020-07-07 HISTORY — DX: Obstructive sleep apnea (adult) (pediatric): G47.33

## 2020-07-09 ENCOUNTER — Encounter (HOSPITAL_BASED_OUTPATIENT_CLINIC_OR_DEPARTMENT_OTHER): Payer: Self-pay | Admitting: Cardiovascular Disease

## 2020-07-09 NOTE — Procedures (Signed)
Patient Name: Robin Landry, Robin Landry Date: 07/07/2020 Gender: Female D.O.B: Apr 07, 1960 Age (years): 59 Referring Provider: Epifanio Lesches Height (inches): 64 Interpreting Physician: Nicki Guadalajara MD, ABSM Weight (lbs): 269 RPSGT: Ulyess Mort BMI: 46 MRN: [id number] Neck Size: 14.50  CLINICAL INFORMATION The patient is referred for a CPAP titration to treat sleep apnea.  Date of HST: 01/07/2020: AHI 67.3/h; O2 nadir 80%.  SLEEP STUDY TECHNIQUE As per the AASM Manual for the Scoring of Sleep and Associated Events v2.3 (April 2016) with a hypopnea requiring 4% desaturations.  The channels recorded and monitored were frontal, central and occipital EEG, electrooculogram (EOG), submentalis EMG (chin), nasal and oral airflow, thoracic and abdominal wall motion, anterior tibialis EMG, snore microphone, electrocardiogram, and pulse oximetry. Continuous positive airway pressure (CPAP) was initiated at the beginning of the study and titrated to treat sleep-disordered breathing.  MEDICATIONS diltiazem (CARDIZEM CD) 240 MG 24 hr capsule ELIQUIS 5 MG TABS tablet Medications self-administered by patient taken the night of the study : ELIQUIS, MELATONIN  TECHNICIAN COMMENTS Comments added by technician: PATIENT WAS ORDERED AS A CPAP TITRATION. Comments added by scorer: N/A  RESPIRATORY PARAMETERS Optimal PAP Pressure (cm): 17 AHI at Optimal Pressure (/hr): 0 Overall Minimal O2 (%): 86.0 Supine % at Optimal Pressure (%): 0 Minimal O2 at Optimal Pressure (%): 97.0   SLEEP ARCHITECTURE The study was initiated at 11:24:58 PM and ended at 5:32:15 AM.  Sleep onset time was 2.4 minutes and the sleep efficiency was 86.7%%. The total sleep time was 318.3 minutes.  The patient spent 8.5%% of the night in stage N1 sleep, 73.6%% in stage N2 sleep, 0.0%% in stage N3 and 17.9% in REM.Stage REM latency was 124.5 minutes  Wake after sleep onset was 46.5. Alpha intrusion was absent.  Supine sleep was 32.67%.  CARDIAC DATA The 2 lead EKG demonstrated sinus rhythm. The mean heart rate was 57.1 beats per minute. Other EKG findings include: PVCs.  LEG MOVEMENT DATA The total Periodic Limb Movements of Sleep (PLMS) were 0. The PLMS index was 0.0. A PLMS index of <15 is considered normal in adults.  IMPRESSIONS - CPAP was initiated at 5 cm and was titrated to optimal PAP pressure at 17 cm of water; AHI 0/h, O2 nadir 97%. - Central sleep apnea was not noted during this titration (CAI = 0.4/h). - Moderate oxygen desaturations to a nadir of 86% at 11 cm of water. - The patient snored with moderate snoring volume during this titration study. - 2-lead EKG demonstrated: PVCs - Clinically significant periodic limb movements were not noted during this study. Arousals associated with PLMs were rare.  DIAGNOSIS - Obstructive Sleep Apnea (G47.33)  RECOMMENDATIONS - Recommend an initial trial of CPAP therapy on 17 cm H2O with heated humidification.  A large size Philips Respironics Full Face Mask Amara View mask was used for the titration. - Effort should be made to optimize nasal and oropharyngeal patency. - Avoid alcohol, sedatives and other CNS depressants that may worsen sleep apnea and disrupt normal sleep architecture. - Sleep hygiene should be reviewed to assess factors that may improve sleep quality. - Weight management and regular exercise should be initiated or continued. - Recommend a download in 30 days and sleep clinic evaluation after 4 weeks of therapy.   [Electronically signed] 07/09/2020 03:26 PM   Nicki Guadalajara MD, [facility], ABSM Diplomate, American Board of Sleep Medicine   NPI: [id number] [facility] PH: [phone]   FX: [phone] ACCREDITED  BY THE AMERICAN ACADEMY OF SLEEP MEDICINE

## 2020-07-10 ENCOUNTER — Other Ambulatory Visit: Payer: Self-pay | Admitting: Cardiology

## 2020-07-12 NOTE — Telephone Encounter (Signed)
This is Dr. Schumann's pt 

## 2020-07-20 ENCOUNTER — Telehealth: Payer: Self-pay | Admitting: *Deleted

## 2020-07-20 NOTE — Telephone Encounter (Signed)
-----   Message from Lennette Bihari, MD sent at 07/09/2020  3:32 PM EDT ----- Burna Mortimer, please notify pt and set up with DME for CPAP initiation.

## 2020-07-20 NOTE — Telephone Encounter (Signed)
Patient notified CPAP titration has been completed and CPAP machine will be ordered. All of her questions were answered. Patient will discuss with Dr Tresa Endo how long she has to use the machine. She does not want to stay on it indefinitely.

## 2020-08-13 ENCOUNTER — Telehealth: Payer: Self-pay | Admitting: Cardiology

## 2020-08-13 MED ORDER — DILTIAZEM HCL ER COATED BEADS 240 MG PO CP24: ORAL_CAPSULE | ORAL | 8 refills | Status: DC

## 2020-08-13 NOTE — Telephone Encounter (Signed)
    *  STAT* If patient is at the pharmacy, call can be transferred to refill team.   1. Which medications need to be refilled? (please list name of each medication and dose if known) diltiazem (CARDIZEM CD) 240 MG 24 hr capsule  2. Which pharmacy/location (including street and city if local pharmacy) is medication to be sent to? [id number] - [place],  - [address]  3. Do they need a 30 day or 90 day supply? 30 days   Pt only have 4 pills left and she will be going out of town tomorrow, need refill today

## 2020-10-04 ENCOUNTER — Telehealth: Payer: Self-pay | Admitting: Cardiology

## 2020-10-04 NOTE — Telephone Encounter (Signed)
Returned call to pt she states that she wants an xray to "visualize her heart" she think that she needs this because it was never done. Informed pt that mostly we do not look at the heart with an xray we will determine this in conjunction with blood work and other testing, there are other tests that are better to have the status of the heart. She is wanting to know when/if she needs another ECHO the last one was done a year ago. She states that she has been out of AFIB since discharge 1 year ago. She is also asking if/when she can stop Eliquis, she "does not want to tak this for the rest of her life". Informed pt that these questions can be answered at her scheduled appointment. She would like to know if she can get some of these questions answered before.Marland KitchenMarland KitchenMarland KitchenShe is ver anxious and like an earlier appointment, she has to drive to [place] and would like to do so before it starts snowing on the way(she is on the wait list). Pt knows that these questions should be addressed at appointment. She would like to see what Dr Bjorn Pippin thinks? I will forwa to see his input

## 2020-10-04 NOTE — Telephone Encounter (Signed)
Patient called to ask that she has a xray done of her heart on her next dr visit. Please advise

## 2020-10-05 NOTE — Telephone Encounter (Signed)
Echo last year was normal, does not need another one this year or x-ray in the absence of symptoms.  I am okay with moving up her appointment if she would like and there is an opening

## 2020-10-05 NOTE — Telephone Encounter (Signed)
Left message to call back  

## 2020-11-07 NOTE — Progress Notes (Signed)
Cardiology Office Note:    Date:  11/08/2020   ID:  Robin Landry, DOB Apr 29, 1960, MRN [id number]  PCP:  Luetta Nutting, DO  Cardiologist:  None  Electrophysiologist:  None   Referring MD: Donald Prose, MD   Chief Complaint  Patient presents with   Atrial Fibrillation     History of Present Illness:    Robin Landry is a 60 y.o. female with a hx of morbid obesity, diabetes, PE after COVID-19 infection September 2021, atrial fibrillation who presents for follow-up.  She was referred by Dr. Nancy Fetter for evaluation of atrial fibrillation, initially seen on 12/03/2019.  She was hospitalized at [facility] in [place] 10/2019.  She had [id number] infection complicated by pulmonary embolism and atrial fibrillation.  CTPA showed right and left lower lobe segmental PEs.  She was started on diltiazem 240 mg daily and Eliquis 5 mg twice daily.  She had an episode of atrial fibrillation with RVR during admission, rate 118.  Was started on diltiazem drip and eventually switched to oral diltiazem.  Echocardiogram on 11/03/2019 showed EF 60-65%, normal RV size/function, no significant valvular disease.  She smoked 0.5 pack/day x 15 years, quit at age 40.  No history of heart disease in her immediate family  Since last clinic visit, she reports that she has been doing well.  Denies any chest pain, dyspnea, lower extremity edema, or palpitations.  Reports had episode of lightheadedness when bending over but denies any syncope.  Has been taking Eliquis but only about 5 days/week.   Wt Readings from Last 3 Encounters:  11/08/20 263 lb 12.8 oz (119.7 kg)  07/07/20 269 lb (122 kg)  05/10/20 271 lb 9.6 oz (123.2 kg)      Past Medical History:  Diagnosis Date   COVID-19    August, hospitalized   History of pulmonary embolus (PE)    OSA (obstructive sleep apnea) 07/07/2020      Current Medications: Current Meds  Medication Sig   Blood Glucose Monitoring Suppl (FREESTYLE LITE) w/Device KIT  daily. use as directed   diltiazem (CARDIZEM CD) 240 MG 24 hr capsule TAKE 1 CAPSULE(240 MG) BY MOUTH DAILY   ELIQUIS 5 MG TABS tablet Take 1 tablet (5 mg total) by mouth 2 (two) times daily.   Lancets (ONETOUCH DELICA PLUS [id number]) MISC Apply 1 each topically daily.   ONETOUCH ULTRA test strip 1 each daily.     Allergies:   Patient has no allergy information on record.   Social History   Socioeconomic History   Marital status: Single    Spouse name: Not on file   Number of children: Not on file   Years of education: Not on file   Highest education level: Not on file  Occupational History   Not on file  Tobacco Use   Smoking status: Former    Packs/day: 0.50    Years: 15.00    Pack years: 7.50    Types: Cigarettes    Quit date: 12/17/1993    Years since quitting: 26.9   Smokeless tobacco: Never  Substance and Sexual Activity   Alcohol use: Not on file   Drug use: Not on file   Sexual activity: Not on file  Other Topics Concern   Not on file  Social History Narrative   Not on file   Social Determinants of Health   Financial Resource Strain: Not on file  Food Insecurity: Not on file  Transportation Needs: Not on file  Physical Activity: Not  on file  Stress: Not on file  Social Connections: Not on file     Family History: No history of heart disease in her immediate family  ROS:   Please see the history of present illness.     All other systems reviewed and are negative.  EKGs/Labs/Other Studies Reviewed:    The following studies were reviewed today:   EKG:  EKG is ordered today.  The ekg ordered today demonstrates normal sinus rhythm, rate 58, nonspecific T wave flattening   Recent Labs: 12/04/2019: ALT 13; BUN 13; Creatinine, Ser 0.86; Hemoglobin 11.4; Platelets 315; Potassium 4.3; Sodium 139; TSH 1.540  Recent Lipid Panel No results found for: CHOL, TRIG, HDL, CHOLHDL, VLDL, LDLCALC, LDLDIRECT  Physical Exam:    VS:  BP 124/68 (BP Location: Right  Arm, Patient Position: Sitting, Cuff Size: Large)   Pulse (!) 58   Ht 5' 4.5" (1.638 m)   Wt 263 lb 12.8 oz (119.7 kg)   SpO2 98%   BMI 44.58 kg/m     Wt Readings from Last 3 Encounters:  11/08/20 263 lb 12.8 oz (119.7 kg)  07/07/20 269 lb (122 kg)  05/10/20 271 lb 9.6 oz (123.2 kg)     GEN:  in no acute distress HEENT: Normal NECK: No JVD; No carotid bruits CARDIAC:RRR, no murmurs, rubs, gallops RESPIRATORY:  Clear to auscultation without rales, wheezing or rhonchi  ABDOMEN: Soft, non-tender, non-distended MUSCULOSKELETAL:  No edema; No deformity  SKIN: Warm and dry NEUROLOGIC:  Alert and oriented x 3 PSYCHIATRIC:  Normal affect   ASSESSMENT:    1. Paroxysmal atrial fibrillation (HCC)   2. Other pulmonary embolism without acute cor pulmonale, unspecified chronicity (HCC)   3. Elevated BP without diagnosis of hypertension   4. OSA (obstructive sleep apnea)     PLAN:    Paroxysmal atrial fibrillation: Diagnosed 10/2019 during admission for acute PE/COVID-19.  CHA2DS2-VASc score 2 (diabetes, female).  Echocardiogram on 11/03/2019 showed EF 60-65%, normal RV size/function, no significant valvular disease. -Continue Eliquis 5 mg twice daily.  She would like to discontinue Eliquis.  She has completed sufficient treatment for her PE.  Unclear if having ongoing A. fib or just occurred in setting of acute illness.  Recommend Zio patch x2 weeks.  If negative could consider stopping Eliquis and monitoring for further A. fib (she has an apple watch that could be used for long-term monitoring) -Continue diltiazem 240 mg daily -Sleep study showed severe OSA, starting on CPAP  Pulmonary embolism: Diagnosed 10/2019.  No evidence of right heart strain on echocardiogram.  Has been on Eliquis 5 mg twice daily   T2DM: Currently diet controlled, A1c 6.3% on 12/29/2019  Elevated BP: Mildly elevated in prior clinic appointment, improved today.  Continue diltiazem  OSA: Sleep study 01/07/2020  showed severe OSA, starting on CPAP.     RTC in 6 months  Medication Adjustments/Labs and Tests Ordered: Current medicines are reviewed at length with the patient today.  Concerns regarding medicines are outlined above.  Orders Placed This Encounter  Procedures   LONG TERM MONITOR (3-14 DAYS)   EKG 12-Lead    No orders of the defined types were placed in this encounter.   Patient Instructions  Medication Instructions:  Your physician recommends that you continue on your current medications as directed. Please refer to the Current Medication list given to you today.  *If you need a refill on your cardiac medications before your next appointment, please call your pharmacy*  Testing/Procedures: ZIO XT-  Long Term Monitor Instructions   Your physician has requested you wear a ZIO patch monitor for _14__ days.  This is a single patch monitor.   [employer] supplies one patch monitor per enrollment. Additional stickers are not available. Please do not apply patch if you will be having a Nuclear Stress Test, Echocardiogram, Cardiac CT, MRI, or Chest Xray during the period you would be wearing the monitor. The patch cannot be worn during these tests. You cannot remove and re-apply the ZIO XT patch monitor.  Your ZIO patch monitor will be sent Fed Ex from [employer] directly to your home address. It may take 3-5 days to receive your monitor after you have been enrolled.  Once you have received your monitor, please review the enclosed instructions. Your monitor has already been registered assigning a specific monitor serial # to you.  Billing and Patient Assistance Program Information   We have supplied [employer] with any of your insurance information on file for billing purposes. [employer] offers a sliding scale Patient Assistance Program for patients that do not have insurance, or whose insurance does not completely cover the cost of the ZIO monitor.   You must apply for the Patient  Assistance Program to qualify for this discounted rate.     To apply, please call [employer] at [phone], select option 4, then select option 2, and ask to apply for Patient Assistance Program.  Theodore Demark will ask your household income, and how many people are in your household.  They will quote your out-of-pocket cost based on that information.  [employer] will also be able to set up a 49-month, interest-free payment plan if needed.  Applying the monitor   Shave hair from upper left chest.  Hold abrader disc by orange tab. Rub abrader in 40 strokes over the upper left chest as indicated in your monitor instructions.  Clean area with 4 enclosed alcohol pads. Let dry.  Apply patch as indicated in monitor instructions. Patch will be placed under collarbone on left side of chest with arrow pointing upward.  Rub patch adhesive wings for 2 minutes. Remove white label marked "1". Remove the white label marked "2". Rub patch adhesive wings for 2 additional minutes.  While looking in a mirror, press and release button in center of patch. A small green light will flash 3-4 times. This will be your only indicator that the monitor has been turned on. ?  Do not shower for the first 24 hours. You may shower after the first 24 hours.  Press the button if you feel a symptom. You will hear a small click. Record Date, Time and Symptom in the Patient Logbook.  When you are ready to remove the patch, follow instructions on the last 2 pages of the Patient Logbook. Stick patch monitor onto the last page of Patient Logbook.  Place Patient Logbook in the blue and white box.  Use locking tab on box and tape box closed securely.  The blue and white box has prepaid postage on it. Please place it in the mailbox as soon as possible. Your physician should have your test results approximately 7 days after the monitor has been mailed back to [facility].  Call [facility] at [phone] if you have questions  regarding your ZIO XT patch monitor. Call them immediately if you see an orange light blinking on your monitor.  If your monitor falls off in less than 4 days, contact our Monitor department at [phone]. ?If your monitor becomes loose  or falls off after 4 days call [employer] at [phone] for suggestions on securing your monitor.?  Follow-Up: At [facility], you and your health needs are our priority.  As part of our continuing mission to provide you with exceptional heart care, we have created designated Provider Care Teams.  These Care Teams include your primary Cardiologist (physician) and Advanced Practice Providers (APPs -  Physician Assistants and Nurse Practitioners) who all work together to provide you with the care you need, when you need it.  We recommend signing up for the patient portal called "MyChart".  Sign up information is provided on this After Visit Summary.  MyChart is used to connect with patients for Virtual Visits (Telemedicine).  Patients are able to view lab/test results, encounter notes, upcoming appointments, etc.  Non-urgent messages can be sent to your provider as well.   To learn more about what you can do with MyChart, go to [email or web address].    Your next appointment:   6 month(s)  The format for your next appointment:   In Person  Provider:   Oswaldo Milian, MD     Signed, Donato Heinz, MD  11/08/2020 5:13 PM    [facility]

## 2020-11-08 ENCOUNTER — Other Ambulatory Visit: Payer: Self-pay

## 2020-11-08 ENCOUNTER — Ambulatory Visit (INDEPENDENT_AMBULATORY_CARE_PROVIDER_SITE_OTHER): Payer: 59

## 2020-11-08 ENCOUNTER — Ambulatory Visit (INDEPENDENT_AMBULATORY_CARE_PROVIDER_SITE_OTHER): Payer: 59 | Admitting: Cardiology

## 2020-11-08 VITALS — BP 124/68 | HR 58 | Ht 64.5 in | Wt 263.8 lb

## 2020-11-08 DIAGNOSIS — R030 Elevated blood-pressure reading, without diagnosis of hypertension: Secondary | ICD-10-CM

## 2020-11-08 DIAGNOSIS — I480 Paroxysmal atrial fibrillation: Secondary | ICD-10-CM

## 2020-11-08 DIAGNOSIS — G4733 Obstructive sleep apnea (adult) (pediatric): Secondary | ICD-10-CM

## 2020-11-08 DIAGNOSIS — I2699 Other pulmonary embolism without acute cor pulmonale: Secondary | ICD-10-CM | POA: Diagnosis not present

## 2020-11-08 NOTE — Patient Instructions (Signed)
Medication Instructions:  Your physician recommends that you continue on your current medications as directed. Please refer to the Current Medication list given to you today.  *If you need a refill on your cardiac medications before your next appointment, please call your pharmacy*  Testing/Procedures: ZIO XT- Long Term Monitor Instructions   Your physician has requested you wear a ZIO patch monitor for _14__ days.  This is a single patch monitor.   [employer] supplies one patch monitor per enrollment. Additional stickers are not available. Please do not apply patch if you will be having a Nuclear Stress Test, Echocardiogram, Cardiac CT, MRI, or Chest Xray during the period you would be wearing the monitor. The patch cannot be worn during these tests. You cannot remove and re-apply the ZIO XT patch monitor.  Your ZIO patch monitor will be sent Fed Ex from [employer] directly to your home address. It may take 3-5 days to receive your monitor after you have been enrolled.  Once you have received your monitor, please review the enclosed instructions. Your monitor has already been registered assigning a specific monitor serial # to you.  Billing and Patient Assistance Program Information   We have supplied [employer] with any of your insurance information on file for billing purposes. [employer] offers a sliding scale Patient Assistance Program for patients that do not have insurance, or whose insurance does not completely cover the cost of the ZIO monitor.   You must apply for the Patient Assistance Program to qualify for this discounted rate.     To apply, please call [employer] at [phone], select option 4, then select option 2, and ask to apply for Patient Assistance Program.  [employer] will ask your household income, and how many people are in your household.  They will quote your out-of-pocket cost based on that information.  [employer] will also be able to set up a 12-month, interest-free payment  plan if needed.  Applying the monitor   Shave hair from upper left chest.  Hold abrader disc by orange tab. Rub abrader in 40 strokes over the upper left chest as indicated in your monitor instructions.  Clean area with 4 enclosed alcohol pads. Let dry.  Apply patch as indicated in monitor instructions. Patch will be placed under collarbone on left side of chest with arrow pointing upward.  Rub patch adhesive wings for 2 minutes. Remove white label marked "1". Remove the white label marked "2". Rub patch adhesive wings for 2 additional minutes.  While looking in a mirror, press and release button in center of patch. A small green light will flash 3-4 times. This will be your only indicator that the monitor has been turned on. ?  Do not shower for the first 24 hours. You may shower after the first 24 hours.  Press the button if you feel a symptom. You will hear a small click. Record Date, Time and Symptom in the Patient Logbook.  When you are ready to remove the patch, follow instructions on the last 2 pages of the Patient Logbook. Stick patch monitor onto the last page of Patient Logbook.  Place Patient Logbook in the blue and white box.  Use locking tab on box and tape box closed securely.  The blue and white box has prepaid postage on it. Please place it in the mailbox as soon as possible. Your physician should have your test results approximately 7 days after the monitor has been mailed back to [employer].  Call [employer] Customer Care   at [phone] if you have questions regarding your ZIO XT patch monitor. Call them immediately if you see an orange light blinking on your monitor.  If your monitor falls off in less than 4 days, contact our Monitor department at [phone]. ?If your monitor becomes loose or falls off after 4 days call [employer] at [phone] for suggestions on securing your monitor.?  Follow-Up: At [facility], you and your health needs are our priority.  As  part of our continuing mission to provide you with exceptional heart care, we have created designated Provider Care Teams.  These Care Teams include your primary Cardiologist (physician) and Advanced Practice Providers (APPs -  Physician Assistants and Nurse Practitioners) who all work together to provide you with the care you need, when you need it.  We recommend signing up for the patient portal called "MyChart".  Sign up information is provided on this After Visit Summary.  MyChart is used to connect with patients for Virtual Visits (Telemedicine).  Patients are able to view lab/test results, encounter notes, upcoming appointments, etc.  Non-urgent messages can be sent to your provider as well.   To learn more about what you can do with MyChart, go to [email or web address].    Your next appointment:   6 month(s)  The format for your next appointment:   In Person  Provider:   Christopher Schumann, MD   

## 2020-11-08 NOTE — Progress Notes (Unsigned)
Patient enrolled for [employer] to mail a 14 day ZIO XT monitor to her address on file. 

## 2020-11-10 ENCOUNTER — Other Ambulatory Visit: Payer: Self-pay

## 2020-11-10 ENCOUNTER — Encounter: Payer: Self-pay | Admitting: Internal Medicine

## 2020-11-10 ENCOUNTER — Ambulatory Visit (INDEPENDENT_AMBULATORY_CARE_PROVIDER_SITE_OTHER): Payer: 59 | Admitting: Internal Medicine

## 2020-11-10 VITALS — BP 130/70 | HR 66 | Temp 98.4°F | Ht 64.0 in | Wt 264.2 lb

## 2020-11-10 DIAGNOSIS — Z86711 Personal history of pulmonary embolism: Secondary | ICD-10-CM

## 2020-11-10 NOTE — Progress Notes (Signed)
[name]    [id number]    1960/07/04  Primary Care Physician:Landry, Robin Batten, DO Date of Appointment: 11/10/2020 Established Patient Visit  Chief complaint:   Chief Complaint  Patient presents with   Follow-up    Pt states need to know if lungs are cleared     HPI: Robin Landry is a 60 y.o. woman who was hospitalized with covid 19 infection complicated by pulmonary embolism and atrial fibrillation in September Landry. Has history of tobacco use disorder 15 pack yearse, quit in 1994. Also has severe OSA and is on CPAP.   Interval Updates: Here for follow up for checking out her lungs. She has had no interval hospitalizations. Denies dyspnea, syncope, coughing. No le edema. She is still on eliquis for her PE. The current plan is for Dr. Bjorn Landry to perform HR monitor testing. If she does not have any further episodes of A. Fib, discontinue Eliquis.   I have reviewed the patient's family social and past medical history and updated as appropriate.   Past Medical History:  Diagnosis Date   COVID-19    August, hospitalized   History of pulmonary embolus (PE)    OSA (obstructive sleep apnea) 07/07/2020    No past surgical history on file.  Family History  Problem Relation Age of Onset   Venous thrombosis Neg Hx     Social History   Occupational History   Not on file  Tobacco Use   Smoking status: Former    Packs/day: 0.50    Years: 15.00    Pack years: 7.50    Types: Cigarettes    Quit date: 12/17/1993    Years since quitting: 26.9   Smokeless tobacco: Never  Substance and Sexual Activity   Alcohol use: Not on file   Drug use: Not on file   Sexual activity: Not on file     Physical Exam: Blood pressure 130/70, pulse 66, temperature 98.4 F (36.9 C), temperature source Oral, height 5\' 4"  (1.626 m), weight 264 lb 3.2 oz (119.8 kg), SpO2 100 %.  Gen:      No acute distress, obese ENT:  no nasal polyps, mucus membranes moist Lungs:    No  increased respiratory effort, symmetric chest wall excursion, clear to auscultation bilaterally, no wheezes or crackles CV:         Regular rate and rhythm; no murmurs, rubs, or gallops.  No pedal edema   Data Reviewed: Imaging:   PFTs: No flowsheet data found.  Labs:  Immunization status:  There is no immunization history on file for this patient.  Assessment:  History of provoked PE in the setting of COVID 19 infection, Sept Landry.  Atrial Fibrillation  Plan/Recommendations:  Robin Landry is here for follow-up after her pulmonary embolism.  She has not brought any of her records from Robin Landry for me to review, but they were reviewed in detail.  I do not know if her echocardiogram at that time showed any right heart strain, but she did have a repeat echo in September Landry with Robin Landry that was normal.  She is here because she wants some absolute assurance that her blood clots are gone.  She does not have any persistence of dyspnea, shortness of breath, cough, hypoxemia, syncope, palpitations.  Do not think clinically she has any signs or symptoms concerning for either acute or chronic pulmonary embolism.  I explained to her that a CT  chest would likely not even be covered by insurance as it is not indicated.  I agree with Dr. Campbell Landry plan to do heart rate monitor testing and discontinue Eliquis if this is normal.  She does not need Eliquis any further from a pulmonary embolism standpoint.  She is somewhat insistent that she needs some kind of testing other than someone looking at her and examining her.  If all of these tests, the least harmful would be a repeat echocardiogram.  I have ordered this.  We will contact her with the results.   Return to Care: As needed   I spent 30 minutes in the care of this patient today including pre-charting, chart review, review of results, face-to-face care, coordination of care and communication with consultants etc.).   Robin Salts, MD Pulmonary and Critical Care Medicine [facility] Office:[phone]

## 2020-11-10 NOTE — Patient Instructions (Signed)
I am ordering an echocardiogram which is an ultrasound of your heart and the major blood vessels.   We will contact you with the results.

## 2020-11-12 DIAGNOSIS — I480 Paroxysmal atrial fibrillation: Secondary | ICD-10-CM

## 2020-11-17 ENCOUNTER — Ambulatory Visit (INDEPENDENT_AMBULATORY_CARE_PROVIDER_SITE_OTHER): Payer: 59 | Admitting: Family Medicine

## 2020-11-17 ENCOUNTER — Encounter: Payer: Self-pay | Admitting: Family Medicine

## 2020-11-17 ENCOUNTER — Telehealth: Payer: Self-pay | Admitting: *Deleted

## 2020-11-17 VITALS — BP 119/69 | HR 65 | Wt 267.0 lb

## 2020-11-17 DIAGNOSIS — Z1231 Encounter for screening mammogram for malignant neoplasm of breast: Secondary | ICD-10-CM | POA: Diagnosis not present

## 2020-11-17 DIAGNOSIS — G4733 Obstructive sleep apnea (adult) (pediatric): Secondary | ICD-10-CM

## 2020-11-17 DIAGNOSIS — Z86711 Personal history of pulmonary embolism: Secondary | ICD-10-CM | POA: Insufficient documentation

## 2020-11-17 DIAGNOSIS — I480 Paroxysmal atrial fibrillation: Secondary | ICD-10-CM

## 2020-11-17 NOTE — Telephone Encounter (Signed)
-----   Message from Harvel Ricks, RN sent at 11/12/2020 11:42 AM EDT ----- Regarding: follow up CPAP [facility],   This patient saw Dr. Bjorn Pippin the other day and she was unsure what company her CPAP orders were sent to.  I was not able to find it or tell by the note.   I was going to give her the number to call to follow up on the timing (as I am sure its delayed or backordered).    Can you follow up with her and provide her the information to call?    Thanks!

## 2020-11-17 NOTE — Assessment & Plan Note (Signed)
This occurred during her illness with COVID as well as pulmonary embolism.  She is managed by cardiology.  Stable with diltiazem at this time.  If she continues to have A. fib she may benefit from continued anticoagulation due to a CHA2DS2-VASc score of 3.

## 2020-11-17 NOTE — Patient Instructions (Signed)
Great to meet you today! Keep follow up with cardiology.  See me again in 6 months or sooner if needed.

## 2020-11-17 NOTE — Progress Notes (Signed)
Robin Landry - 60 y.o. female MRN [id number]  Date of birth: 1960/05/03  Subjective Chief Complaint  Patient presents with   Establish Care    HPI Robin Landry is a 60 year old female here today for initial visit to establish care.  She had been in relatively good health until getting COVID last year.  This was complicated by pulmonary embolism as well as atrial fibrillation.  She has established with pulmonology for management of her history of pulmonary embolism.  She was told by her pulmonologist that she could now discontinue Eliquis.  She is also seeing cardiology for history of atrial fibrillation.  She is on diltiazem with good rate control with this.  She does currently have a Zio patch over the next couple weeks to determine her current A. fib burden.  She denies any symptoms related to this including chest pain, shortness of breath, palpitations, headache or vision changes.  Her goal is to be able to get off all of her current medications.  ROS:  A comprehensive ROS was completed and negative except as noted per HPI  No Known Allergies  Past Medical History:  Diagnosis Date   Atrial fibrillation (HCC)    COVID-19    August, hospitalized   Diabetes mellitus without complication (HCC)    History of pulmonary embolus (PE)    OSA (obstructive sleep apnea) 07/07/2020   Pulmonary embolism (HCC)     Past Surgical History:  Procedure Laterality Date   ABDOMINAL HYSTERECTOMY      Social History   Socioeconomic History   Marital status: Single    Spouse name: Not on file   Number of children: Not on file   Years of education: Not on file   Highest education level: Not on file  Occupational History   Not on file  Tobacco Use   Smoking status: Former    Packs/day: 0.50    Years: 15.00    Pack years: 7.50    Types: Cigarettes    Quit date: 12/17/1993    Years since quitting: 26.9   Smokeless tobacco: Never  Vaping Use   Vaping Use: Never used  Substance and  Sexual Activity   Alcohol use: Yes    Comment: sometimes   Drug use: Never   Sexual activity: Not Currently  Other Topics Concern   Not on file  Social History Narrative   Not on file   Social Determinants of Health   Financial Resource Strain: Not on file  Food Insecurity: Not on file  Transportation Needs: Not on file  Physical Activity: Not on file  Stress: Not on file  Social Connections: Not on file    Family History  Problem Relation Age of Onset   Venous thrombosis Neg Hx     Health Maintenance  Topic Date Due   MAMMOGRAM  Never done   COVID-19 Vaccine (1) 12/03/2020 (Originally 02/14/1961)   Zoster Vaccines- Shingrix (1 of 2) 02/16/2021 (Originally 08/14/2012)   INFLUENZA VACCINE  05/27/2021 (Originally 09/27/2020)   COLONOSCOPY (Pts 45-31yrs Insurance coverage will need to be confirmed)  11/17/2021 (Originally 08/15/2007)   TETANUS/TDAP  11/17/2021 (Originally 08/14/1981)   Hepatitis C Screening  11/17/2021 (Originally 08/14/1980)   HIV Screening  11/17/2021 (Originally 08/14/1977)   HPV VACCINES  Aged Out   PAP SMEAR-Modifier  Discontinued     ----------------------------------------------------------------------------------------------------------------------------------------------------------------------------------------------------------------- Physical Exam BP 119/69   Pulse 65   Wt 267 lb (121.1 kg)   SpO2 100% Comment: on RA  BMI 45.83 kg/m  Physical Exam Constitutional:      Appearance: Normal appearance.  Eyes:     General: No scleral icterus. Cardiovascular:     Rate and Rhythm: Normal rate and regular rhythm.  Pulmonary:     Effort: Pulmonary effort is normal.     Breath sounds: Normal breath sounds.  Musculoskeletal:     Cervical back: Neck supple.  Neurological:     General: No focal deficit present.     Mental Status: She is alert.  Psychiatric:        Mood and Affect: Mood normal.        Behavior: Behavior normal.     ------------------------------------------------------------------------------------------------------------------------------------------------------------------------------------------------------------------- Assessment and Plan  OSA (obstructive sleep apnea) Recommend compliance with CPAP.  Paroxysmal atrial fibrillation (HCC) This occurred during her illness with COVID as well as pulmonary embolism.  She is managed by cardiology.  Stable with diltiazem at this time.  If she continues to have A. fib she may benefit from continued anticoagulation due to a CHA2DS2-VASc score of 3.  History of pulmonary embolism Provoked episode.  She has discontinued anticoagulation.  Stable at this time   No orders of the defined types were placed in this encounter.   Return in about 6 months (around 05/17/2021) for PAF.    This visit occurred during the SARS-CoV-2 public health emergency.  Safety protocols were in place, including screening questions prior to the visit, additional usage of staff PPE, and extensive cleaning of exam room while observing appropriate contact time as indicated for disinfecting solutions.

## 2020-11-17 NOTE — Assessment & Plan Note (Signed)
Provoked episode.  She has discontinued anticoagulation.  Stable at this time

## 2020-11-17 NOTE — Telephone Encounter (Signed)
DME information sent to patient via mychart message.

## 2020-11-17 NOTE — Assessment & Plan Note (Signed)
Recommend compliance with CPAP ?

## 2020-11-19 ENCOUNTER — Ambulatory Visit: Payer: 59 | Admitting: Medical-Surgical

## 2020-11-23 ENCOUNTER — Telehealth: Payer: Self-pay | Admitting: Cardiology

## 2020-11-23 NOTE — Telephone Encounter (Signed)
Returned call to patient, advised patient to remove monitor and go ahead and mail back to company. Advised patient to call back to office with any issues, questions, or concerns. Patient verbalized understanding.

## 2020-11-23 NOTE — Telephone Encounter (Signed)
New message:    Patient calling stating that her her ZIO patch keeps blinking orange. She is going to take it off would like to speak a nurse to see if 11 days will longer enough.

## 2020-11-24 ENCOUNTER — Ambulatory Visit: Payer: 59 | Admitting: Cardiology

## 2021-01-05 ENCOUNTER — Other Ambulatory Visit: Payer: Self-pay

## 2021-01-05 ENCOUNTER — Ambulatory Visit
Admission: RE | Admit: 2021-01-05 | Discharge: 2021-01-05 | Disposition: A | Discharge status: Home / Self Care | Payer: 59 | Source: Ambulatory Visit | Attending: Family Medicine | Admitting: Family Medicine

## 2021-01-05 DIAGNOSIS — Z1231 Encounter for screening mammogram for malignant neoplasm of breast: Secondary | ICD-10-CM

## 2021-05-02 ENCOUNTER — Telehealth: Payer: Self-pay | Admitting: Cardiology

## 2021-05-02 NOTE — Telephone Encounter (Signed)
Called patient no answer.Left message on personal voice mail to call back. 

## 2021-05-02 NOTE — Telephone Encounter (Signed)
Patient returned call

## 2021-05-02 NOTE — Telephone Encounter (Signed)
Spoke to patient she stated she has been having chest discomfort,chest pressure off and on for the past 2 days.No chest discomfort and chest pressure at present.She is presently in a store shopping.She will be going out of town this Wed 3/8 to 3/16.Appointment scheduled with Dr.Schumann 3/22 at 10:40 am.Advised if chest pressure becomes worse to go to ED. ?

## 2021-05-02 NOTE — Telephone Encounter (Signed)
This was sent Via mychart to the scheduling pool:   ? ? ?Do I need a prescription for nitroglycerin?  ? ?I have not taken any  nitroglycerin. I?ll have to look that up.  ? ?No vomiting, felt nauseated once. Started about three days ago.  it?s not exactly a pain. It?s a discomfort, pressure, something that?s not normal for me. It comes and goes not feeling anything right now. I?m going to [place] for a wedding on Wednesday so I was trying to check to make sure I?m not experiencing A- fibrillation again.  ? ? ? ?Good Afternoon Halayna,  ?Can you tell me a little more about your chest pain?   ? ? ?1. Are you having CP right now?  ? ?2. Are you experiencing any other symptoms (ex. SOB, nausea, vomiting, sweating)?  ? ?3. How long have you been experiencing CP?  ? ?4. Is your CP continuous or coming and going?  ? ?5. Have you taken Nitroglycerin?  ??  ? ? ?Appointment Request From: [name] ? ?With Provider: Little Ishikawa, MD [facility]] ? ?Preferred Date Range: 05/02/2021 - 05/03/2021 ? ?Preferred Times: Any Time ? ?Reason for visit: Office Visit ? ?Comments: ?Feeling some pressure in my chest area and not feel normal.  ?Want to be sure I am not experiencing a-fib again.  ?

## 2021-05-17 NOTE — Progress Notes (Signed)
?Cardiology Office Note:   ? ?Date:  05/18/2021  ? ?ID:  Robin Landry, DOB [birth year], MRN [id number] ? ?PCP:  Everrett Coombe, DO  ?Cardiologist:  None  ?Electrophysiologist:  None  ? ?Referring MD: Everrett Coombe, DO  ? ?Chief Complaint  ?Patient presents with  ? Chest Pain  ? ? ? ?History of Present Illness:   ? ?Robin Landry is a 61 y.o. female with a hx of morbid obesity, diabetes, PE after COVID-19 infection September 2021, atrial fibrillation who presents for follow-up.  She was referred by Dr. Wynelle Link for evaluation of atrial fibrillation, initially seen on 12/03/2019.  She was hospitalized at [facility] in [place] 10/2019.  She had COVID-19 infection complicated by pulmonary embolism and atrial fibrillation.  CTPA showed right and left lower lobe segmental PEs.  She was started on diltiazem 240 mg daily and Eliquis 5 mg twice daily.  She had an episode of atrial fibrillation with RVR during admission, rate 118.  Was started on diltiazem drip and eventually switched to oral diltiazem.  Echocardiogram on 11/03/2019 showed EF 60-65%, normal RV size/function, no significant valvular disease.  She smoked 0.5 pack/day x 15 years, quit at age 89.  No history of heart disease in her immediate family ? ?Zio patch x11 days on 11/26/2020 showed no atrial fibrillation, frequent PACs (5.5% of beats) and occasional supraventricular couplets (2.7%), 72 episodes of SVT with longest lasting 19 beats with average rate 104 bpm. ? ?Since last clinic visit, she reports that she has been doing okay.  Reports has had intermittent chest tightness over the last 4 weeks, describes as pressure across upper chest.  Has happened about 5 times, lasts for few minutes and resolves.  Occurs at rest.  She is not exercising.  Denies any dyspnea, lower extremity edema, or syncope.  Does report intermittent lightheadedness.  She stopped taking Eliquis and diltiazem.  She did not start CPAP. ? ? ?Wt Readings from Last 3 Encounters:   ?05/18/21 270 lb (122.5 kg)  ?11/17/20 267 lb (121.1 kg)  ?11/10/20 264 lb 3.2 oz (119.8 kg)  ? ? ? ? ?Past Medical History:  ?Diagnosis Date  ? Atrial fibrillation (HCC)   ? COVID-19   ? August, hospitalized  ? Diabetes mellitus without complication (HCC)   ? History of pulmonary embolus (PE)   ? OSA (obstructive sleep apnea) 07/07/2020  ? Pulmonary embolism (HCC)   ? ? ? ? ?Current Medications: ?Current Meds  ?Medication Sig  ? metoprolol tartrate (LOPRESSOR) 100 MG tablet Take 1 tablet (100 mg) TWO hours prior to CT scan  ? [DISCONTINUED] MULTIPLE VITAMIN PO Take by mouth.  ?  ? ?Allergies:   Patient has no known allergies.  ? ?Social History  ? ?Socioeconomic History  ? Marital status: Single  ?  Spouse name: Not on file  ? Number of children: Not on file  ? Years of education: Not on file  ? Highest education level: Not on file  ?Occupational History  ? Not on file  ?Tobacco Use  ? Smoking status: Former  ?  Packs/day: 0.50  ?  Years: 15.00  ?  Pack years: 7.50  ?  Types: Cigarettes  ?  Quit date: 12/17/1993  ?  Years since quitting: 27.4  ? Smokeless tobacco: Never  ?Vaping Use  ? Vaping Use: Never used  ?Substance and Sexual Activity  ? Alcohol use: Yes  ?  Comment: sometimes  ? Drug use: Never  ? Sexual activity: Not Currently  ?  Other Topics Concern  ? Not on file  ?Social History Narrative  ? Not on file  ? ?Social Determinants of Health  ? ?Financial Resource Strain: Not on file  ?Food Insecurity: Not on file  ?Transportation Needs: Not on file  ?Physical Activity: Not on file  ?Stress: Not on file  ?Social Connections: Not on file  ?  ? ?Family History: ?No history of heart disease in her immediate family ? ?ROS:   ?Please see the history of present illness.    ? All other systems reviewed and are negative. ? ?EKGs/Labs/Other Studies Reviewed:   ? ?The following studies were reviewed today: ? ? ?EKG:   ?05/18/2021: Normal sinus rhythm, rate 76, nonspecific T wave flattening ? ? ?Recent Labs: ?No results  found for requested labs within last 8760 hours.  ?Recent Lipid Panel ?No results found for: CHOL, TRIG, HDL, CHOLHDL, VLDL, LDLCALC, LDLDIRECT ? ?Physical Exam:   ? ?VS:  BP 118/66   Pulse 76   Ht 5\' 4"  (1.626 m)   Wt 270 lb (122.5 kg)   SpO2 99%   BMI 46.35 kg/m?    ? ?Wt Readings from Last 3 Encounters:  ?05/18/21 270 lb (122.5 kg)  ?11/17/20 267 lb (121.1 kg)  ?11/10/20 264 lb 3.2 oz (119.8 kg)  ?  ? ?GEN:  in no acute distress ?HEENT: Normal ?NECK: No JVD; No carotid bruits ?CARDIAC:RRR, no murmurs, rubs, gallops ?RESPIRATORY:  Clear to auscultation without rales, wheezing or rhonchi  ?ABDOMEN: Soft, non-tender, non-distended ?MUSCULOSKELETAL:  No edema; No deformity  ?SKIN: Warm and dry ?NEUROLOGIC:  Alert and oriented x 3 ?PSYCHIATRIC:  Normal affect  ? ?ASSESSMENT:   ? ?1. Chest pain of uncertain etiology   ?2. Paroxysmal atrial fibrillation (HCC)   ?3. OSA (obstructive sleep apnea)   ?4. Hyperlipidemia, unspecified hyperlipidemia type   ? ? ? ?PLAN:   ? ?Chest pain: Atypical description but does have CAD risk factors (age, T2DM).  Recommend coronary CTA to rule out obstructive CAD.  Will give Lopressor 100 mg prior to study ? ?Paroxysmal atrial fibrillation: Diagnosed 10/2019 during admission for acute PE/COVID-19.  CHA2DS2-VASc score 2 (diabetes, female).  Echocardiogram on 11/03/2019 showed EF 60-65%, normal RV size/function, no significant valvular disease.  Zio patch x11 days on 11/26/2020 showed no atrial fibrillation, frequent PACs (5.5% of beats) and occasional supraventricular couplets (2.7%), 72 episodes of SVT with longest lasting 19 beats with average rate 104 bpm. ?-She requested to discontinue Eliquis after completing sufficient treatment for PE.  Unclear if having ongoing A-fib or just occurred in setting of her acute illness.  Zio patch x11 days 11/26/2020 showed no further A-fib.  Eliquis was stopped per patient request ?-Recommended continuing diltiazem but she discontinued ?-Sleep study  showed severe OSA, recommend starting on CPAP but she declines ? ?Pulmonary embolism: Diagnosed 10/2019.  No evidence of right heart strain on echocardiogram.  Completed course of Eliquis for 1 year ? ?T2DM: Currently diet controlled, A1c 6.4% on 05/31/2020 ? ?Elevated BP: Elevated at prior clinic visit but appears well controlled today despite no antihypertensives.  Continue to monitor ? ?OSA: Sleep study 01/07/2020 showed severe OSA, recommend starting on CPAP but she declines ? ?Hyperlipidemia: LDL 114 on 05/31/2020.  Will recheck lipid panel and follow-up results of coronary CTA to guide how aggressive to be in lowering cholesterol ? ? ?RTC in 6 months ? ?Medication Adjustments/Labs and Tests Ordered: ?Current medicines are reviewed at length with the patient today.  Concerns regarding medicines  are outlined above.  ?Orders Placed This Encounter  ?Procedures  ? CT CORONARY MORPH W/CTA COR W/SCORE W/CA W/CM &/OR WO/CM  ? Basic metabolic panel  ? CBC  ? Lipid panel  ? EKG 12-Lead  ? ? ?Meds ordered this encounter  ?Medications  ? metoprolol tartrate (LOPRESSOR) 100 MG tablet  ?  Sig: Take 1 tablet (100 mg) TWO hours prior to CT scan  ?  Dispense:  1 tablet  ?  Refill:  0  ? ? ? ?Patient Instructions  ?Medication Instructions:  ?Your physician recommends that you continue on your current medications as directed. Please refer to the Current Medication list given to you today. ? ?*If you need a refill on your cardiac medications before your next appointment, please call your pharmacy* ? ? ?Lab Work: ?BMET, CBC, Lipid today ? ?If you have labs (blood work) drawn today and your tests are completely normal, you will receive your results only by: ?MyChart Message (if you have MyChart) OR ?A paper copy in the mail ?If you have any lab test that is abnormal or we need to change your treatment, we will call you to review the results. ? ? ?Testing/Procedures: ?Coronary CTA-see instructions below ? ?Follow-Up: ?At [facility],  you and your health needs are our priority.  As part of our continuing mission to provide you with exceptional heart care, we have created designated Provider Care Teams.  These Care Teams include your primary

## 2021-05-18 ENCOUNTER — Encounter: Payer: Self-pay | Admitting: Cardiology

## 2021-05-18 ENCOUNTER — Ambulatory Visit (INDEPENDENT_AMBULATORY_CARE_PROVIDER_SITE_OTHER): Payer: 59 | Admitting: Cardiology

## 2021-05-18 ENCOUNTER — Other Ambulatory Visit: Payer: Self-pay

## 2021-05-18 VITALS — BP 118/66 | HR 76 | Ht 64.0 in | Wt 270.0 lb

## 2021-05-18 DIAGNOSIS — E785 Hyperlipidemia, unspecified: Secondary | ICD-10-CM

## 2021-05-18 DIAGNOSIS — G4733 Obstructive sleep apnea (adult) (pediatric): Secondary | ICD-10-CM | POA: Diagnosis not present

## 2021-05-18 DIAGNOSIS — I480 Paroxysmal atrial fibrillation: Secondary | ICD-10-CM | POA: Diagnosis not present

## 2021-05-18 DIAGNOSIS — R079 Chest pain, unspecified: Secondary | ICD-10-CM

## 2021-05-18 LAB — SPECIMEN STATUS REPORT

## 2021-05-18 MED ORDER — METOPROLOL TARTRATE 100 MG PO TABS: ORAL_TABLET | ORAL | 0 refills | Status: DC

## 2021-05-18 NOTE — Patient Instructions (Signed)
Medication Instructions:  ?Your physician recommends that you continue on your current medications as directed. Please refer to the Current Medication list given to you today. ? ?*If you need a refill on your cardiac medications before your next appointment, please call your pharmacy* ? ? ?Lab Work: ?BMET, CBC, Lipid today ? ?If you have labs (blood work) drawn today and your tests are completely normal, you will receive your results only by: ?MyChart Message (if you have MyChart) OR ?A paper copy in the mail ?If you have any lab test that is abnormal or we need to change your treatment, we will call you to review the results. ? ? ?Testing/Procedures: ?Coronary CTA-see instructions below ? ?Follow-Up: ?At [facility], you and your health needs are our priority.  As part of our continuing mission to provide you with exceptional heart care, we have created designated Provider Care Teams.  These Care Teams include your primary Cardiologist (physician) and Advanced Practice Providers (APPs -  Physician Assistants and Nurse Practitioners) who all work together to provide you with the care you need, when you need it. ? ?We recommend signing up for the patient portal called "MyChart".  Sign up information is provided on this After Visit Summary.  MyChart is used to connect with patients for Virtual Visits (Telemedicine).  Patients are able to view lab/test results, encounter notes, upcoming appointments, etc.  Non-urgent messages can be sent to your provider as well.   ?To learn more about what you can do with MyChart, go to [email or web address].   ? ?Your next appointment:   ?6 month(s) ? ?The format for your next appointment:   ?In Person ? ?Provider:   ?Dr. Bjorn Pippin ? ?Other Instructions ? ? ?Your cardiac CT will be scheduled at one of the below locations:  ? ?[facility] ?[address] ?[place], [place] [address] ?[phone] ? ?If scheduled at [facility], please arrive at the [facility] and  Children's Entrance (Entrance C2) of [facility] 30 minutes prior to test start time. ?You can use the FREE valet parking offered at entrance C (encouraged to control the heart rate for the test)  ?Proceed to the [facility] Radiology Department (first floor) to check-in and test prep. ? ?All radiology patients and guests should use entrance C2 at [facility], accessed from [facility], even though the hospital's physical address listed is [address]. ? ? ? ? ?Please follow these instructions carefully (unless otherwise directed): ? ?On the Night Before the Test: ?Be sure to Drink plenty of water. ?Do not consume any caffeinated/decaffeinated beverages or chocolate 12 hours prior to your test. ?Do not take any antihistamines 12 hours prior to your test. ? ?On the Day of the Test: ?Drink plenty of water until 1 hour prior to the test. ?Do not eat any food 4 hours prior to the test. ?You may take your regular medications prior to the test.  ?Take metoprolol (Lopressor) two hours prior to test. ?FEMALES- please wear underwire-free bra if available, avoid dresses & tight clothing ? ?   After the Test: ?Drink plenty of water. ?After receiving IV contrast, you may experience a mild flushed feeling. This is normal. ?On occasion, you may experience a mild rash up to 24 hours after the test. This is not dangerous. If this occurs, you can take Benadryl 25 mg and increase your fluid intake. ?If you experience trouble breathing, this can be serious. If it is severe call 911 IMMEDIATELY.  If it is mild, please call our office. ?If you take any of these medications: Glipizide/Metformin, Avandament, Glucavance, please do not take 48 hours after completing test unless otherwise instructed. ? ?We will call to schedule your test 2-4 weeks out understanding that some insurance companies will need an authorization prior to the service being performed.  ? ?For non-scheduling related questions,  please contact the cardiac imaging nurse navigator should you have any questions/concerns: ?Rockwell Alexandria, Cardiac Imaging Nurse Navigator ?Larey Brick, Cardiac Imaging Nurse Navigator ?[facility] ?Direct Office Dial: [phone]  ? ?For scheduling needs, including cancellations and rescheduling, please call [place], [phone]. ? ? ?

## 2021-05-26 ENCOUNTER — Other Ambulatory Visit: Payer: Self-pay | Admitting: Cardiology

## 2021-05-30 ENCOUNTER — Telehealth (HOSPITAL_COMMUNITY): Payer: Self-pay | Admitting: *Deleted

## 2021-05-30 NOTE — Telephone Encounter (Signed)
Patient transferred from the pre-service center to me regarding upcoming cardiac imaging study; pt verbalizes understanding of appt date/time, parking situation and where to check in, pre-test NPO status and medications ordered, and verified current allergies; name and call back number provided for further questions should they arise ? ?Larey Brick RN Navigator Cardiac Imaging ?[facility] ?[phone] office ?[phone] cell ? ?Patient to take 100mg  metoprolol tartrate two hours prior to her cardiac CT.  She is aware to arrive at 11:30am. ?

## 2021-06-01 ENCOUNTER — Ambulatory Visit (HOSPITAL_COMMUNITY)
Admission: RE | Admit: 2021-06-01 | Discharge: 2021-06-01 | Disposition: A | Discharge status: Home / Self Care | Payer: 59 | Source: Ambulatory Visit | Attending: Cardiology | Admitting: Cardiology

## 2021-06-01 DIAGNOSIS — R079 Chest pain, unspecified: Secondary | ICD-10-CM | POA: Insufficient documentation

## 2021-06-01 MED ORDER — IOHEXOL 350 MG/ML SOLN
100.0000 mL | Freq: Once | INTRAVENOUS | Status: AC | PRN
Start: 2021-06-01 — End: 2021-06-01
  Administered 2021-06-01: 100 mL via INTRAVENOUS

## 2021-06-01 MED ORDER — NITROGLYCERIN 0.4 MG SL SUBL
0.8000 mg | SUBLINGUAL_TABLET | Freq: Once | SUBLINGUAL | Status: AC
  Administered 2021-06-01: 0.8 mg via SUBLINGUAL

## 2021-06-01 MED ORDER — NITROGLYCERIN 0.4 MG SL SUBL
SUBLINGUAL_TABLET | SUBLINGUAL | Status: DC
Start: 2021-06-01 — End: 2021-06-02
  Filled 2021-06-01: qty 2

## 2021-06-02 ENCOUNTER — Other Ambulatory Visit: Payer: Self-pay | Admitting: *Deleted

## 2021-06-02 DIAGNOSIS — R079 Chest pain, unspecified: Secondary | ICD-10-CM

## 2021-06-02 DIAGNOSIS — I480 Paroxysmal atrial fibrillation: Secondary | ICD-10-CM

## 2021-06-20 ENCOUNTER — Ambulatory Visit (HOSPITAL_COMMUNITY): Payer: 59

## 2021-06-20 ENCOUNTER — Encounter (HOSPITAL_COMMUNITY): Payer: Self-pay

## 2021-07-01 DIAGNOSIS — H5213 Myopia, bilateral: Secondary | ICD-10-CM | POA: Diagnosis not present

## 2021-07-05 LAB — LIPID PANEL
Chol/HDL Ratio: 4.2 ratio (ref 0.0–4.4)
Cholesterol, Total: 201 mg/dL — ABNORMAL HIGH (ref 100–199)
HDL: 48 mg/dL (ref >39)
LDL Chol Calc (NIH): 130 mg/dL — ABNORMAL HIGH (ref 0–99)
Triglycerides: 128 mg/dL (ref 0–149)
VLDL Cholesterol Cal: 23 mg/dL (ref 5–40)

## 2021-07-05 LAB — CBC

## 2021-07-05 LAB — BASIC METABOLIC PANEL
BUN/Creatinine Ratio: 14 (ref 9–23)
BUN: 11 mg/dL (ref 6–24)
CO2: 23 mmol/L (ref 20–29)
Calcium: 9 mg/dL (ref 8.7–10.2)
Chloride: 106 mmol/L (ref 96–106)
Creatinine, Ser: 0.76 mg/dL (ref 0.57–1.00)
Glucose: 154 mg/dL — ABNORMAL HIGH (ref 70–99)
Potassium: 4.6 mmol/L (ref 3.5–5.2)
Sodium: 140 mmol/L (ref 134–144)
eGFR: 91 mL/min/{1.73_m2} (ref >59)

## 2021-07-20 DIAGNOSIS — H524 Presbyopia: Secondary | ICD-10-CM | POA: Diagnosis not present

## 2021-07-20 DIAGNOSIS — H52222 Regular astigmatism, left eye: Secondary | ICD-10-CM | POA: Diagnosis not present

## 2021-08-17 ENCOUNTER — Ambulatory Visit (HOSPITAL_COMMUNITY): Payer: 59 | Attending: Cardiology

## 2021-08-17 DIAGNOSIS — R079 Chest pain, unspecified: Secondary | ICD-10-CM

## 2021-08-17 DIAGNOSIS — I480 Paroxysmal atrial fibrillation: Secondary | ICD-10-CM | POA: Diagnosis present

## 2021-08-17 LAB — ECHOCARDIOGRAM COMPLETE
Area-P 1/2: 2.97 cm2
S' Lateral: 2.9 cm

## 2021-11-07 ENCOUNTER — Telehealth: Payer: 59 | Admitting: Family Medicine

## 2021-11-08 ENCOUNTER — Ambulatory Visit: Payer: 59 | Admitting: Family Medicine

## 2021-11-11 ENCOUNTER — Ambulatory Visit (INDEPENDENT_AMBULATORY_CARE_PROVIDER_SITE_OTHER): Payer: Commercial Managed Care - HMO | Admitting: Family Medicine

## 2021-11-11 ENCOUNTER — Encounter: Payer: Self-pay | Admitting: Family Medicine

## 2021-11-11 VITALS — BP 135/83 | HR 75 | Ht 64.0 in | Wt 264.0 lb

## 2021-11-11 DIAGNOSIS — R7303 Prediabetes: Secondary | ICD-10-CM

## 2021-11-11 DIAGNOSIS — I480 Paroxysmal atrial fibrillation: Secondary | ICD-10-CM | POA: Diagnosis not present

## 2021-11-11 DIAGNOSIS — G4733 Obstructive sleep apnea (adult) (pediatric): Secondary | ICD-10-CM

## 2021-11-11 DIAGNOSIS — Z1211 Encounter for screening for malignant neoplasm of colon: Secondary | ICD-10-CM | POA: Diagnosis not present

## 2021-11-11 DIAGNOSIS — G4730 Sleep apnea, unspecified: Secondary | ICD-10-CM

## 2021-11-13 NOTE — Assessment & Plan Note (Signed)
Refuses CPAP.  Discussed that untreated sleep apnea can oftentimes be a barrier for weight loss.

## 2021-11-13 NOTE — Assessment & Plan Note (Signed)
Updated thyroid function test as well as A1c.  Requesting referral for weight loss surgery.  Referral entered for bariatrics at [facility].

## 2021-11-13 NOTE — Assessment & Plan Note (Addendum)
Singular episode following PE.  Followed by cardiology at this time.  Stable.

## 2021-11-13 NOTE — Progress Notes (Signed)
Robin Landry - 61 y.o. female MRN [id number]  Date of birth: 02/20/1961  Subjective Chief Complaint  Patient presents with   Weight Loss    HPI Robin Landry is a 61 year old female here today for follow-up visit.  She has history of obesity with comorbid conditions of severe obstructive sleep apnea, paroxysmal atrial fibrillation and prediabetes.  She has had difficulty with losing weight over the years.  She does not use CPAP for management of her sleep apnea.  She has been seeing bariatrics to [facility] previously.  She is interested in having weight loss surgery and is requesting a referral for this.  She is due for colon cancer screening.  Has never had colonoscopy or Cologuard in the past.  Requesting referral for this.    Reports having abnormal thyroid function tests in the past and would like to have this rechecked.   ROS:  A comprehensive ROS was completed and negative except as noted per HPI    Allergies  Allergen Reactions   Ivp Dye [Iodinated Contrast Media] Nausea And Vomiting    Past Medical History:  Diagnosis Date   Atrial fibrillation (HCC)    COVID-19    August, hospitalized   Diabetes mellitus without complication (HCC)    History of pulmonary embolus (PE)    OSA (obstructive sleep apnea) 07/07/2020   Pulmonary embolism (HCC)     Past Surgical History:  Procedure Laterality Date   ABDOMINAL HYSTERECTOMY      Social History   Socioeconomic History   Marital status: Single    Spouse name: Not on file   Number of children: Not on file   Years of education: Not on file   Highest education level: Not on file  Occupational History   Not on file  Tobacco Use   Smoking status: Former    Packs/day: 0.50    Years: 15.00    Total pack years: 7.50    Types: Cigarettes    Quit date: 12/17/1993    Years since quitting: 27.9   Smokeless tobacco: Never  Vaping Use   Vaping Use: Never used  Substance and Sexual Activity   Alcohol use: Yes     Comment: sometimes   Drug use: Never   Sexual activity: Not Currently  Other Topics Concern   Not on file  Social History Narrative   Not on file   Social Determinants of Health   Financial Resource Strain: Not on file  Food Insecurity: Not on file  Transportation Needs: Not on file  Physical Activity: Not on file  Stress: Not on file  Social Connections: Not on file    Family History  Problem Relation Age of Onset   Venous thrombosis Neg Hx    Breast cancer Neg Hx     Health Maintenance  Topic Date Due   COLONOSCOPY (Pts 45-52yrs Insurance coverage will need to be confirmed)  11/17/2021 (Originally 08/15/2007)   TETANUS/TDAP  11/17/2021 (Originally 08/14/1981)   Hepatitis C Screening  11/17/2021 (Originally 08/14/1980)   HIV Screening  11/17/2021 (Originally 08/14/1977)   INFLUENZA VACCINE  05/28/2022 (Originally 09/27/2021)   COVID-19 Vaccine (1) 05/28/2022 (Originally 02/14/1961)   Zoster Vaccines- Shingrix (1 of 2) 05/28/2022 (Originally 08/14/2012)   MAMMOGRAM  01/06/2023   HPV VACCINES  Aged Out   PAP SMEAR-Modifier  Discontinued     ----------------------------------------------------------------------------------------------------------------------------------------------------------------------------------------------------------------- Physical Exam BP 135/83 (BP Location: Left Wrist, Patient Position: Sitting, Cuff Size: Large)   Pulse 75   Ht 5\' 4"  (1.626 m)  Wt 264 lb (119.7 kg)   SpO2 99%   BMI 45.32 kg/m   Physical Exam HENT:     Head: Normocephalic and atraumatic.  Cardiovascular:     Rate and Rhythm: Normal rate and regular rhythm.  Pulmonary:     Effort: Pulmonary effort is normal.     Breath sounds: Normal breath sounds.  Musculoskeletal:     Cervical back: Neck supple.  Psychiatric:        Mood and Affect: Mood normal.        Behavior: Behavior normal.      ------------------------------------------------------------------------------------------------------------------------------------------------------------------------------------------------------------------- Assessment and Plan  Paroxysmal atrial fibrillation (HCC) Singular episode following PE.  Followed by cardiology at this time.  Stable.  OSA (obstructive sleep apnea) Refuses CPAP.  Discussed that untreated sleep apnea can oftentimes be a barrier for weight loss.  Morbid obesity ([place]) Updated thyroid function test as well as A1c.  Requesting referral for weight loss surgery.  Referral entered for bariatrics at [facility].   No orders of the defined types were placed in this encounter.   No follow-ups on file.    This visit occurred during the SARS-CoV-2 public health emergency.  Safety protocols were in place, including screening questions prior to the visit, additional usage of staff PPE, and extensive cleaning of exam room while observing appropriate contact time as indicated for disinfecting solutions.

## 2021-11-15 DIAGNOSIS — I480 Paroxysmal atrial fibrillation: Secondary | ICD-10-CM | POA: Diagnosis not present

## 2021-11-15 DIAGNOSIS — R7303 Prediabetes: Secondary | ICD-10-CM | POA: Diagnosis not present

## 2021-11-16 LAB — HEMOGLOBIN A1C
Hgb A1c MFr Bld: 6 % of total Hgb — ABNORMAL HIGH (ref <5.7)
Mean Plasma Glucose: 126 mg/dL
eAG (mmol/L): 7 mmol/L

## 2021-11-16 LAB — T4, FREE: Free T4: 1.1 ng/dL (ref 0.8–1.8)

## 2021-11-16 LAB — TSH: TSH: 2.01 mIU/L (ref 0.40–4.50)

## 2021-12-28 DIAGNOSIS — I480 Paroxysmal atrial fibrillation: Secondary | ICD-10-CM | POA: Diagnosis not present

## 2021-12-28 DIAGNOSIS — G4733 Obstructive sleep apnea (adult) (pediatric): Secondary | ICD-10-CM | POA: Diagnosis not present

## 2021-12-28 DIAGNOSIS — Z86711 Personal history of pulmonary embolism: Secondary | ICD-10-CM | POA: Diagnosis not present

## 2021-12-28 DIAGNOSIS — R7303 Prediabetes: Secondary | ICD-10-CM | POA: Diagnosis not present

## 2021-12-28 DIAGNOSIS — Z6841 Body Mass Index (BMI) 40.0 and over, adult: Secondary | ICD-10-CM | POA: Diagnosis not present

## 2022-01-17 DIAGNOSIS — Z6841 Body Mass Index (BMI) 40.0 and over, adult: Secondary | ICD-10-CM | POA: Diagnosis not present

## 2022-01-17 DIAGNOSIS — R635 Abnormal weight gain: Secondary | ICD-10-CM | POA: Diagnosis not present

## 2022-01-25 ENCOUNTER — Encounter: Payer: Self-pay | Admitting: Family Medicine

## 2022-01-25 ENCOUNTER — Ambulatory Visit (INDEPENDENT_AMBULATORY_CARE_PROVIDER_SITE_OTHER): Payer: Commercial Managed Care - HMO | Admitting: Family Medicine

## 2022-01-25 ENCOUNTER — Ambulatory Visit (INDEPENDENT_AMBULATORY_CARE_PROVIDER_SITE_OTHER): Payer: Commercial Managed Care - HMO

## 2022-01-25 VITALS — BP 112/62 | HR 64 | Ht 64.0 in | Wt 269.0 lb

## 2022-01-25 DIAGNOSIS — R221 Localized swelling, mass and lump, neck: Secondary | ICD-10-CM | POA: Diagnosis not present

## 2022-01-25 DIAGNOSIS — M7989 Other specified soft tissue disorders: Secondary | ICD-10-CM

## 2022-01-25 DIAGNOSIS — M545 Low back pain, unspecified: Secondary | ICD-10-CM | POA: Diagnosis not present

## 2022-01-25 MED ORDER — MELOXICAM 15 MG PO TABS: 15.0000 mg | ORAL_TABLET | Freq: Every day | ORAL | 0 refills | Status: DC

## 2022-01-25 NOTE — Progress Notes (Signed)
Robin Landry - 61 y.o. female MRN [id number]  Date of birth: January 29, 1961  Subjective Chief Complaint  Patient presents with   Back Pain   neck swelling    HPI Robin Landry is a 61 year old female here today with complaint of back pain.  Pain is located along the lower back bilaterally.  Pain does not really radiate.  Denies numbness, tingling or weakness to lower extremities.  Pain is worse with standing or walking for prolonged periods.  Also increased when going from sitting to standing.  Has not tried anything for for.  Does not recall any specific injury or overuse.  She does also have a mass on her upper back/lower neck area.  This has been here for several months.  Has really changed in size.  Painful at times.  ROS:  A comprehensive ROS was completed and negative except as noted per HPI  Allergies  Allergen Reactions   Ivp Dye [Iodinated Contrast Media] Nausea And Vomiting    Past Medical History:  Diagnosis Date   Atrial fibrillation (HCC)    COVID-19    August, hospitalized   Diabetes mellitus without complication (HCC)    History of pulmonary embolus (PE)    OSA (obstructive sleep apnea) 07/07/2020   Pulmonary embolism (HCC)     Past Surgical History:  Procedure Laterality Date   ABDOMINAL HYSTERECTOMY      Social History   Socioeconomic History   Marital status: Single    Spouse name: Not on file   Number of children: Not on file   Years of education: Not on file   Highest education level: Not on file  Occupational History   Not on file  Tobacco Use   Smoking status: Former    Packs/day: 0.50    Years: 15.00    Total pack years: 7.50    Types: Cigarettes    Quit date: 12/17/1993    Years since quitting: 28.1   Smokeless tobacco: Never  Vaping Use   Vaping Use: Never used  Substance and Sexual Activity   Alcohol use: Yes    Comment: sometimes   Drug use: Never   Sexual activity: Not Currently  Other Topics Concern   Not on file  Social  History Narrative   Not on file   Social Determinants of Health   Financial Resource Strain: Not on file  Food Insecurity: Not on file  Transportation Needs: Not on file  Physical Activity: Not on file  Stress: Not on file  Social Connections: Not on file    Family History  Problem Relation Age of Onset   Venous thrombosis Neg Hx    Breast cancer Neg Hx     Health Maintenance  Topic Date Due   DTaP/Tdap/Td (1 - Tdap) Never done   INFLUENZA VACCINE  05/28/2022 (Originally 09/27/2021)   COVID-19 Vaccine (1) 05/28/2022 (Originally 02/14/1961)   Zoster Vaccines- Shingrix (1 of 2) 05/28/2022 (Originally 08/14/2012)   COLONOSCOPY (Pts 45-68yrs Insurance coverage will need to be confirmed)  01/26/2023 (Originally 08/15/2007)   Hepatitis C Screening  01/26/2023 (Originally 08/14/1980)   HIV Screening  01/26/2023 (Originally 08/14/1977)   MAMMOGRAM  01/06/2023   HPV VACCINES  Aged Out   PAP SMEAR-Modifier  Discontinued     ----------------------------------------------------------------------------------------------------------------------------------------------------------------------------------------------------------------- Physical Exam BP 112/62 (BP Location: Left Wrist, Patient Position: Sitting, Cuff Size: Normal)   Pulse 64   Ht 5\' 4"  (1.626 m)   Wt 269 lb (122 kg)   SpO2 99%   BMI 46.17 kg/m  Physical Exam Constitutional:      Appearance: Normal appearance.  HENT:     Head: Normocephalic and atraumatic.  Eyes:     General: No scleral icterus. Cardiovascular:     Rate and Rhythm: Normal rate and regular rhythm.  Pulmonary:     Effort: Pulmonary effort is normal.     Breath sounds: Normal breath sounds.  Musculoskeletal:     Cervical back: Neck supple.     Comments: Fairly soft, oblong, subcutaneous mass along the right upper trapezius.  Area is nontender.  Neurological:     Mental Status: She is alert.  Psychiatric:        Mood and Affect: Mood normal.         Behavior: Behavior normal.     ------------------------------------------------------------------------------------------------------------------------------------------------------------------------------------------------------------------- Assessment and Plan  Low back pain Start meloxicam 15 mg daily x2 weeks then as needed afterwards.  Given handout for home exercise program.  Follow-up if not improving over the next few weeks.  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  Ultrasound of area ordered.   Meds ordered this encounter  Medications   meloxicam (MOBIC) 15 MG tablet    Sig: Take 1 tablet (15 mg total) by mouth daily.    Dispense:  30 tablet    Refill:  0    No follow-ups on file.  Also check in the past 6 months everything.  The right knee pain refer Saline  This visit occurred during the SARS-CoV-2 public health emergency.  Safety protocols were in place, including screening questions prior to the visit, additional usage of staff PPE, and extensive cleaning of exam room while observing appropriate contact time as indicated for disinfecting solutions.

## 2022-01-25 NOTE — Patient Instructions (Signed)
Try meloxicam 15mg  daily x2 weeks then daily as needed.  Start home exercises.  Let me know if not improving.

## 2022-01-29 DIAGNOSIS — M7989 Other specified soft tissue disorders: Secondary | ICD-10-CM | POA: Insufficient documentation

## 2022-01-29 DIAGNOSIS — M545 Low back pain, unspecified: Secondary | ICD-10-CM | POA: Insufficient documentation

## 2022-01-29 NOTE — Assessment & Plan Note (Signed)
Start meloxicam 15 mg daily x2 weeks then as needed afterwards.  Given handout for home exercise program.  Follow-up if not improving over the next few weeks.

## 2022-01-29 NOTE — Assessment & Plan Note (Signed)
Area seems to be consistent with lipoma.  Ultrasound of area ordered.

## 2022-01-31 ENCOUNTER — Ambulatory Visit (AMBULATORY_SURGERY_CENTER): Payer: Medicaid Other | Admitting: *Deleted

## 2022-01-31 ENCOUNTER — Other Ambulatory Visit: Payer: Self-pay

## 2022-01-31 VITALS — Ht 64.5 in | Wt 265.0 lb

## 2022-01-31 DIAGNOSIS — Z1211 Encounter for screening for malignant neoplasm of colon: Secondary | ICD-10-CM

## 2022-01-31 MED ORDER — PLENVU 140 G PO SOLR: 1.0000 | Freq: Once | ORAL | 0 refills | Status: AC

## 2022-01-31 NOTE — Progress Notes (Signed)
Pre visit completed in person.  Instructions forwarded through MyChart  No egg or soy allergy known to patient  No issues known to pt with past sedation with any surgeries or procedures Patient denies ever being told they had issues or difficulty with intubation  No FH of Malignant Hyperthermia Pt is not on diet pills Pt is not on  home 02  Pt is not on blood thinners  Pt denies issues with constipation  No A fib or A flutter after one episode Pt instructed to use [email or web address] or GoodRx for a price reduction on prep

## 2022-02-09 DIAGNOSIS — F54 Psychological and behavioral factors associated with disorders or diseases classified elsewhere: Secondary | ICD-10-CM | POA: Diagnosis not present

## 2022-02-09 DIAGNOSIS — Z6841 Body Mass Index (BMI) 40.0 and over, adult: Secondary | ICD-10-CM | POA: Diagnosis not present

## 2022-02-09 DIAGNOSIS — F4320 Adjustment disorder, unspecified: Secondary | ICD-10-CM | POA: Diagnosis not present

## 2022-02-13 ENCOUNTER — Telehealth: Payer: Self-pay | Admitting: Gastroenterology

## 2022-02-13 NOTE — Telephone Encounter (Signed)
Patient called requested to reschedule her procedure for tomorrow due to scheduling conflict is now scheduled for 03/01/22.

## 2022-02-14 ENCOUNTER — Encounter: Payer: Medicaid Other | Admitting: Gastroenterology

## 2022-02-21 ENCOUNTER — Other Ambulatory Visit: Payer: Self-pay | Admitting: Family Medicine

## 2022-02-28 NOTE — Telephone Encounter (Signed)
Please update prep instructions.

## 2022-03-01 ENCOUNTER — Ambulatory Visit (AMBULATORY_SURGERY_CENTER): Payer: Medicaid Other | Admitting: Gastroenterology

## 2022-03-01 ENCOUNTER — Encounter: Payer: Self-pay | Admitting: Gastroenterology

## 2022-03-01 VITALS — BP 137/86 | HR 61 | Temp 97.8°F | Resp 12 | Ht 64.0 in | Wt 265.8 lb

## 2022-03-01 DIAGNOSIS — Z1211 Encounter for screening for malignant neoplasm of colon: Secondary | ICD-10-CM | POA: Diagnosis not present

## 2022-03-01 DIAGNOSIS — G4733 Obstructive sleep apnea (adult) (pediatric): Secondary | ICD-10-CM | POA: Diagnosis not present

## 2022-03-01 MED ORDER — SODIUM CHLORIDE 0.9 % IV SOLN: 500.0000 mL | Freq: Once | INTRAVENOUS | Status: DC

## 2022-03-01 NOTE — Telephone Encounter (Signed)
Called patient, and stated that she took her first dose of her plenvu at 12:30am due to not having updated instructions. Advised patient to take the second half of her plenvu at 11am, to stop drinking at 1pm, and to arrive at 3pm. Pt verbalized understanding and had no further concerns at the end of the call.

## 2022-03-01 NOTE — Progress Notes (Unsigned)
VS by DT  Pt's states no medical or surgical changes since previsit or office visit.  

## 2022-03-01 NOTE — Patient Instructions (Signed)
YOU HAD AN ENDOSCOPIC PROCEDURE TODAY AT THE [facility]:   Refer to the procedure report that was given to you for any specific questions about what was found during the examination.  If the procedure report does not answer your questions, please call your gastroenterologist to clarify.  If you requested that your care partner not be given the details of your procedure findings, then the procedure report has been included in a sealed envelope for you to review at your convenience later.  YOU SHOULD EXPECT: Some feelings of bloating in the abdomen. Passage of more gas than usual.  Walking can help get rid of the air that was put into your GI tract during the procedure and reduce the bloating. If you had a lower endoscopy (such as a colonoscopy or flexible sigmoidoscopy) you may notice spotting of blood in your stool or on the toilet paper. If you underwent a bowel prep for your procedure, you may not have a normal bowel movement for a few days.  Please Note:  You might notice some irritation and congestion in your nose or some drainage.  This is from the oxygen used during your procedure.  There is no need for concern and it should clear up in a day or so.  SYMPTOMS TO REPORT IMMEDIATELY:  Following lower endoscopy (colonoscopy or flexible sigmoidoscopy):  Excessive amounts of blood in the stool  Significant tenderness or worsening of abdominal pains  Swelling of the abdomen that is new, acute  Fever of 100F or higher  For urgent or emergent issues, a gastroenterologist can be reached at any hour by calling [phone]. Do not use MyChart messaging for urgent concerns.    DIET:  We do recommend a small meal at first, but then you may proceed to your regular diet.  Drink plenty of fluids but you should avoid alcoholic beverages for 24 hours.  ACTIVITY:  You should plan to take it easy for the rest of today and you should NOT DRIVE or use heavy machinery until tomorrow (because of  the sedation medicines used during the test).    FOLLOW UP: Our staff will call the number listed on your records the next business day following your procedure.  We will call around 7:15- 8:00 am to check on you and address any questions or concerns that you may have regarding the information given to you following your procedure. If we do not reach you, we will leave a message.     If any biopsies were taken you will be contacted by phone or by letter within the next 1-3 weeks.  Please call us at [phone] if you have not heard about the biopsies in 3 weeks.    SIGNATURES/CONFIDENTIALITY: You and/or your care partner have signed paperwork which will be entered into your electronic medical record.  These signatures attest to the fact that that the information above on your After Visit Summary has been reviewed and is understood.  Full responsibility of the confidentiality of this discharge information lies with you and/or your care-partner.  

## 2022-03-01 NOTE — Progress Notes (Signed)
History & Physical  Primary Care Physician:  Luetta Nutting, DO Primary Gastroenterologist: Lucio Edward, MD  CHIEF COMPLAINT:  CRC screening  HPI: Robin Landry is a 62 y.o. female CRC screening, average risk, for colonoscopy.   Past Medical History:  Diagnosis Date   Atrial fibrillation [facility])    COVID-19    August, hospitalized   History of pulmonary embolus (PE)    OSA (obstructive sleep apnea) 07/07/2020   Pre-diabetes    Pulmonary embolism (HCC)     Past Surgical History:  Procedure Laterality Date   ABDOMINAL HYSTERECTOMY      Prior to Admission medications   Medication Sig Start Date End Date Taking? Authorizing Provider  ascorbic acid (VITAMIN C) 1000 MG tablet Take 1,000 mg by mouth daily.   Yes [provider]  MAGNESIUM OXIDE PO Take by mouth.   Yes [provider]  meloxicam (MOBIC) 15 MG tablet TAKE 1 TABLET(15 MG) BY MOUTH DAILY 02/21/22  Yes Luetta Nutting, DO  Zinc 50 MG TABS Take by mouth.   Yes [provider]    Current Outpatient Medications  Medication Sig Dispense Refill   ascorbic acid (VITAMIN C) 1000 MG tablet Take 1,000 mg by mouth daily.     MAGNESIUM OXIDE PO Take by mouth.     meloxicam (MOBIC) 15 MG tablet TAKE 1 TABLET(15 MG) BY MOUTH DAILY 30 tablet 0   Zinc 50 MG TABS Take by mouth.     Current Facility-Administered Medications  Medication Dose Route Frequency Provider Last Rate Last Admin   0.9 %  sodium chloride infusion  500 mL Intravenous Once Ladene Artist, MD        Allergies as of 03/01/2022 - Review Complete 03/01/2022  Allergen Reaction Noted   Ivp dye [iodinated contrast media] Nausea And Vomiting 11/11/2021    Family History  Problem Relation Age of Onset   Venous thrombosis Neg Hx    Breast cancer Neg Hx    Colon polyps Neg Hx    Rectal cancer Neg Hx    Esophageal cancer Neg Hx     Social History   Socioeconomic History   Marital status: Single    Spouse name: Not on file    Number of children: Not on file   Years of education: Not on file   Highest education level: Not on file  Occupational History   Not on file  Tobacco Use   Smoking status: Former    Packs/day: 0.50    Years: 15.00    Total pack years: 7.50    Types: Cigarettes    Quit date: 12/17/1993    Years since quitting: 28.2   Smokeless tobacco: Never  Vaping Use   Vaping Use: Never used  Substance and Sexual Activity   Alcohol use: Yes    Comment: sometimes   Drug use: Never   Sexual activity: Not Currently  Other Topics Concern   Not on file  Social History Narrative   Not on file   Social Determinants of Health   Financial Resource Strain: Not on file  Food Insecurity: Not on file  Transportation Needs: Not on file  Physical Activity: Not on file  Stress: Not on file  Social Connections: Not on file  Intimate Partner Violence: Not on file    Review of Systems:  All systems reviewed were negative except where noted in HPI.   Physical Exam: General:  Alert, well-developed, in NAD Head:  Normocephalic and atraumatic. Eyes:  Sclera  clear, no icterus.   Conjunctiva pink. Ears:  Normal auditory acuity. Mouth:  No deformity or lesions.  Neck:  Supple; no masses . Lungs:  Clear throughout to auscultation.   No wheezes, crackles, or rhonchi. No acute distress. Heart:  Regular rate and rhythm; no murmurs. Abdomen:  Soft, nondistended, nontender. No masses, hepatomegaly. No obvious masses.  Normal bowel .    Rectal:  Deferred   Msk:  Symmetrical without gross deformities.. Pulses:  Normal pulses noted. Extremities:  Without edema. Neurologic:  Alert and  oriented x4;  grossly normal neurologically. Skin:  Intact without significant lesions or rashes. Psych:  Alert and cooperative. Normal mood and affect.   Impression / Plan:   CRC screening, average risk, for colonoscopy.  Robin Landry. Fuller Plan  03/01/2022, 3:50 PM See Shea Evans, [facility], to contact our on call  provider

## 2022-03-01 NOTE — Op Note (Signed)
[facility] Patient Name: Robin Landry Procedure Date: 03/01/2022 3:44 PM MRN: [id number] Endoscopist: Meryl Dare , MD, [phone] Age: 62 Referring MD:  Date of Birth: 04/27/1960 Gender: Female Account #: [id number] Procedure:                Colonoscopy Indications:              Screening for colorectal malignant neoplasm Medicines:                Monitored Anesthesia Care Procedure:                Pre-Anesthesia Assessment:                           - Prior to the procedure, a History and Physical                            was performed, and patient medications and                            allergies were reviewed. The patient's tolerance of                            previous anesthesia was also reviewed. The risks                            and benefits of the procedure and the sedation                            options and risks were discussed with the patient.                            All questions were answered, and informed consent                            was obtained. Prior Anticoagulants: The patient has                            taken no anticoagulant or antiplatelet agents. ASA                            Grade Assessment: II - A patient with mild systemic                            disease. After reviewing the risks and benefits,                            the patient was deemed in satisfactory condition to                            undergo the procedure.                           After obtaining informed consent, the colonoscope  was passed under direct vision. Throughout the                            procedure, the patient's blood pressure, pulse, and                            oxygen saturations were monitored continuously. The                            Olympus CF-HQ190L [id number]) Colonoscope was                            introduced through the anus and advanced to the the                            cecum,  identified by appendiceal orifice and                            ileocecal valve. The ileocecal valve, appendiceal                            orifice, and rectum were photographed. The quality                            of the bowel preparation was excellent. The                            colonoscopy was performed without difficulty. The                            patient tolerated the procedure well. Scope In: 3:56:40 PM Scope Out: 4:08:57 PM Scope Withdrawal Time: 0 hours 8 minutes 1 second  Total Procedure Duration: 0 hours 12 minutes 17 seconds  Findings:                 The perianal and digital rectal examinations were                            normal.                           The entire examined colon appeared normal on direct                            and retroflexion views. Complications:            No immediate complications. Estimated blood loss:                            None. Estimated Blood Loss:     Estimated blood loss: none. Impression:               - The entire examined colon is normal on direct and                            retroflexion views.                           -  No specimens collected. Recommendation:           - Repeat colonoscopy in 10 years for screening                            purposes.                           - Patient has a contact number available for                            emergencies. The signs and symptoms of potential                            delayed complications were discussed with the                            patient. Return to normal activities tomorrow.                            Written discharge instructions were provided to the                            patient.                           - Resume previous diet.                           - Continue present medications. Ladene Artist, MD 03/01/2022 4:11:53 PM This report has been signed electronically.

## 2022-03-01 NOTE — Progress Notes (Unsigned)
Approx 1602 pt started having clearfluid run out the side of her mouth.  We had just reached to cecum.  They did not have to give abd pressure but did have to rooll her on her belly.  Started suctioning and stopped sedation.  Before pt woke up she did start coughing and spitting bile colored fluid in to the suction.  Sats nevere dropped.  Sedation never restarted BS clear in immediate PACU.

## 2022-03-02 ENCOUNTER — Telehealth: Payer: Self-pay | Admitting: *Deleted

## 2022-03-02 NOTE — Telephone Encounter (Signed)
  Follow up Call-     03/01/2022    3:28 PM  Call back number  Post procedure Call Back phone  # [phone]  Permission to leave phone message Yes     Patient questions:  Do you have a fever, pain , or abdominal swelling? No. Pain Score  0 *  Have you tolerated food without any problems? Yes.    Have you been able to return to your normal activities? Yes.    Do you have any questions about your discharge instructions: Diet   No. Medications  No. Follow up visit  No.  Do you have questions or concerns about your Care? No.  Actions: * If pain score is 4 or above: No action needed, pain <4.

## 2022-03-06 DIAGNOSIS — Z713 Dietary counseling and surveillance: Secondary | ICD-10-CM | POA: Diagnosis not present

## 2022-03-06 DIAGNOSIS — G4733 Obstructive sleep apnea (adult) (pediatric): Secondary | ICD-10-CM | POA: Diagnosis not present

## 2022-03-06 DIAGNOSIS — R7303 Prediabetes: Secondary | ICD-10-CM | POA: Diagnosis not present

## 2022-03-06 DIAGNOSIS — Z86711 Personal history of pulmonary embolism: Secondary | ICD-10-CM | POA: Diagnosis not present

## 2022-03-06 DIAGNOSIS — Z6841 Body Mass Index (BMI) 40.0 and over, adult: Secondary | ICD-10-CM | POA: Diagnosis not present

## 2022-03-06 DIAGNOSIS — I480 Paroxysmal atrial fibrillation: Secondary | ICD-10-CM | POA: Diagnosis not present

## 2022-03-08 DIAGNOSIS — F4320 Adjustment disorder, unspecified: Secondary | ICD-10-CM | POA: Diagnosis not present

## 2022-03-08 DIAGNOSIS — F54 Psychological and behavioral factors associated with disorders or diseases classified elsewhere: Secondary | ICD-10-CM | POA: Diagnosis not present

## 2022-04-03 DIAGNOSIS — Z6841 Body Mass Index (BMI) 40.0 and over, adult: Secondary | ICD-10-CM | POA: Diagnosis not present

## 2022-04-03 DIAGNOSIS — Z713 Dietary counseling and surveillance: Secondary | ICD-10-CM | POA: Diagnosis not present

## 2022-04-27 DIAGNOSIS — G4733 Obstructive sleep apnea (adult) (pediatric): Secondary | ICD-10-CM | POA: Diagnosis not present

## 2022-05-04 DIAGNOSIS — G4733 Obstructive sleep apnea (adult) (pediatric): Secondary | ICD-10-CM | POA: Diagnosis not present

## 2022-05-04 DIAGNOSIS — Z6841 Body Mass Index (BMI) 40.0 and over, adult: Secondary | ICD-10-CM | POA: Diagnosis not present

## 2022-05-04 DIAGNOSIS — Z713 Dietary counseling and surveillance: Secondary | ICD-10-CM | POA: Diagnosis not present

## 2022-05-04 DIAGNOSIS — Z86711 Personal history of pulmonary embolism: Secondary | ICD-10-CM | POA: Diagnosis not present

## 2022-05-04 DIAGNOSIS — R7303 Prediabetes: Secondary | ICD-10-CM | POA: Diagnosis not present

## 2022-05-04 DIAGNOSIS — I480 Paroxysmal atrial fibrillation: Secondary | ICD-10-CM | POA: Diagnosis not present

## 2022-05-09 DIAGNOSIS — Z86711 Personal history of pulmonary embolism: Secondary | ICD-10-CM | POA: Diagnosis not present

## 2022-05-09 DIAGNOSIS — G4733 Obstructive sleep apnea (adult) (pediatric): Secondary | ICD-10-CM | POA: Diagnosis not present

## 2022-05-09 DIAGNOSIS — R7303 Prediabetes: Secondary | ICD-10-CM | POA: Diagnosis not present

## 2022-05-09 DIAGNOSIS — I480 Paroxysmal atrial fibrillation: Secondary | ICD-10-CM | POA: Diagnosis not present

## 2022-05-23 DIAGNOSIS — Z713 Dietary counseling and surveillance: Secondary | ICD-10-CM | POA: Diagnosis not present

## 2022-06-21 DIAGNOSIS — G4733 Obstructive sleep apnea (adult) (pediatric): Secondary | ICD-10-CM | POA: Diagnosis not present

## 2022-06-22 DIAGNOSIS — G4733 Obstructive sleep apnea (adult) (pediatric): Secondary | ICD-10-CM | POA: Diagnosis not present

## 2023-05-19 IMAGING — MG MM DIGITAL SCREENING BILAT W/ TOMO AND CAD
8 series · 8 of 24 positions shown · non-contrast
Comparison: None.

CLINICAL DATA: Screening.

EXAM:
DIGITAL SCREENING BILATERAL MAMMOGRAM WITH TOMOSYNTHESIS AND CAD
TECHNIQUE: Bilateral screening digital craniocaudal and mediolateral oblique
mammograms were obtained. Bilateral screening digital breast
tomosynthesis was performed. The images were evaluated with
computer-aided detection.

[R MLO synth-2D]
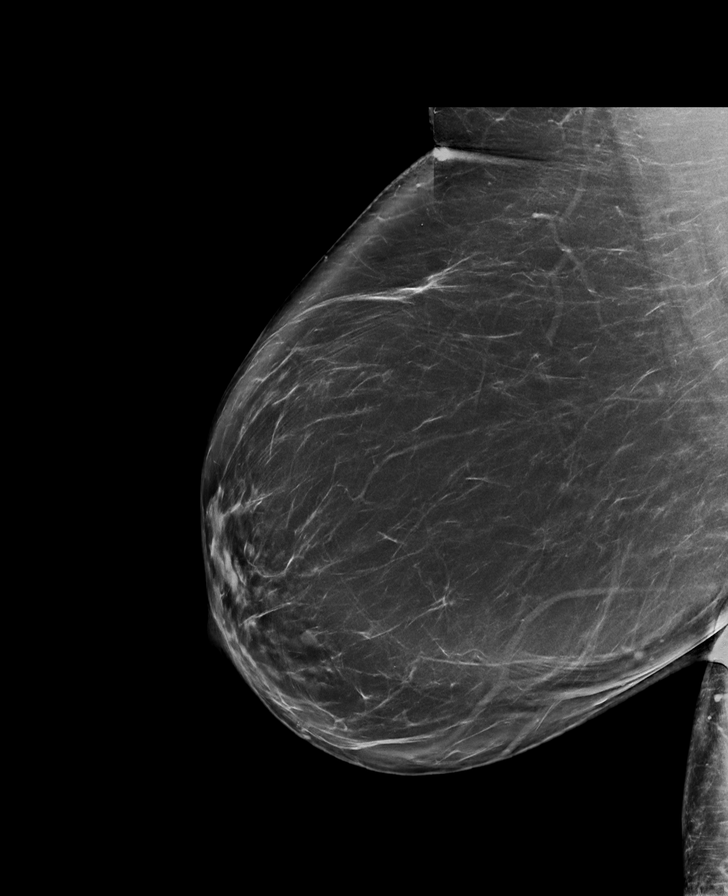

[L CC synth-2D]
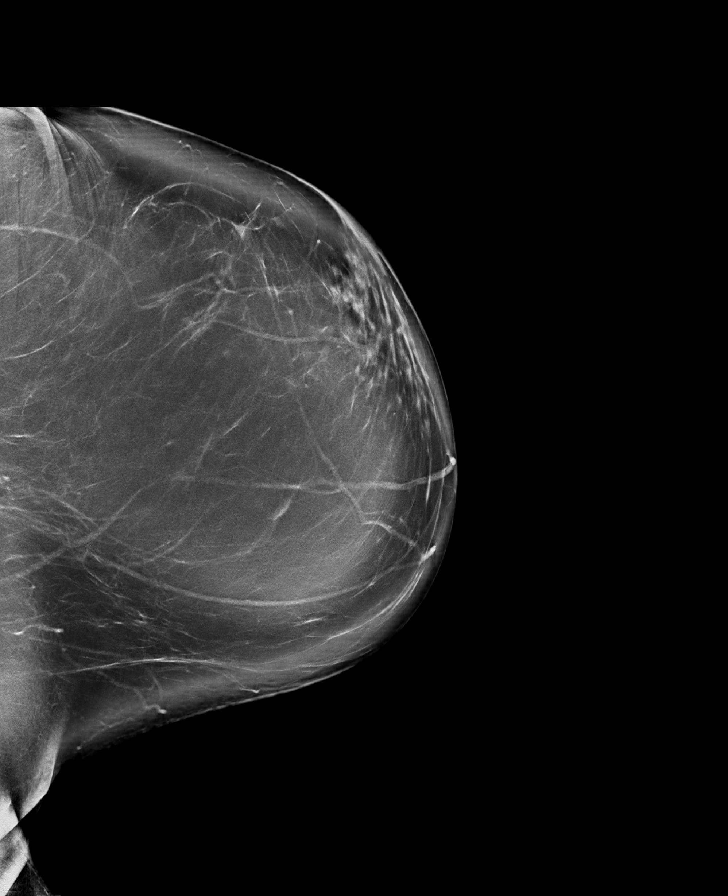

[R CC synth-2D]
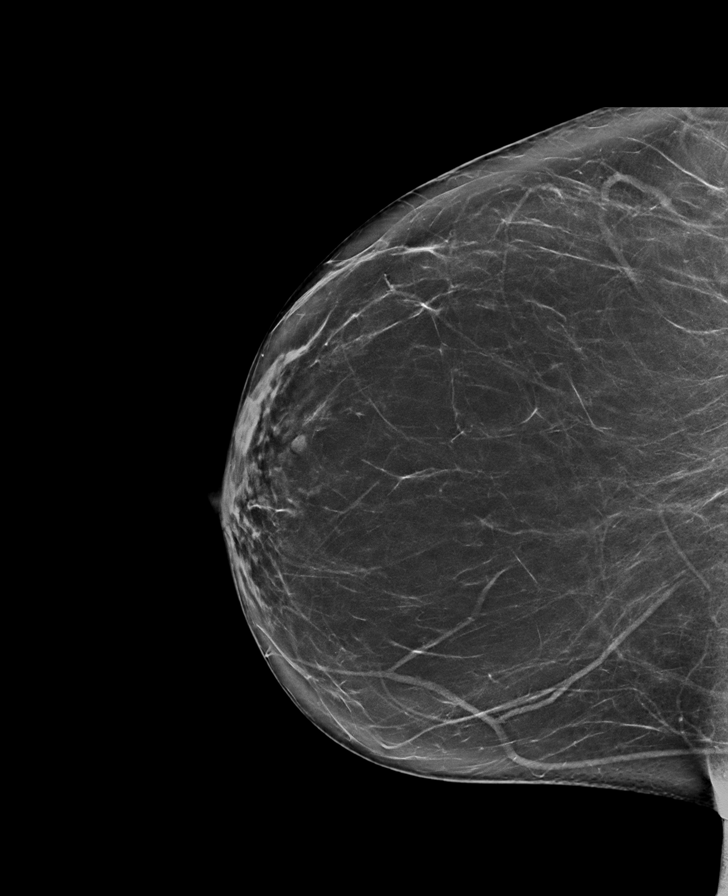

[L MLO synth-2D]
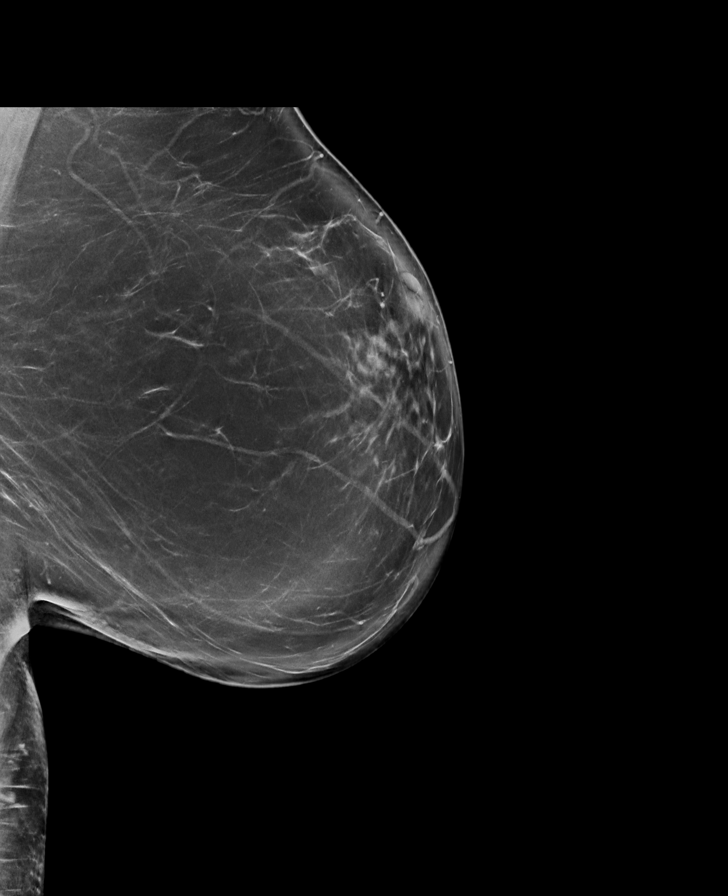

[R MLO tomo · tomo slice 51/100.0]
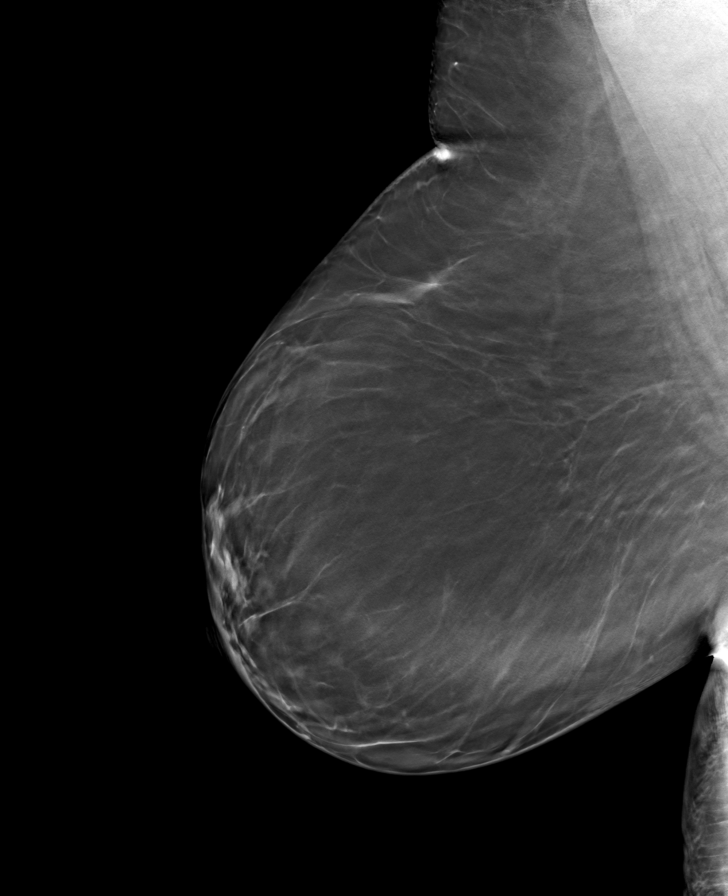

[L MLO tomo · tomo slice 52/103.0]
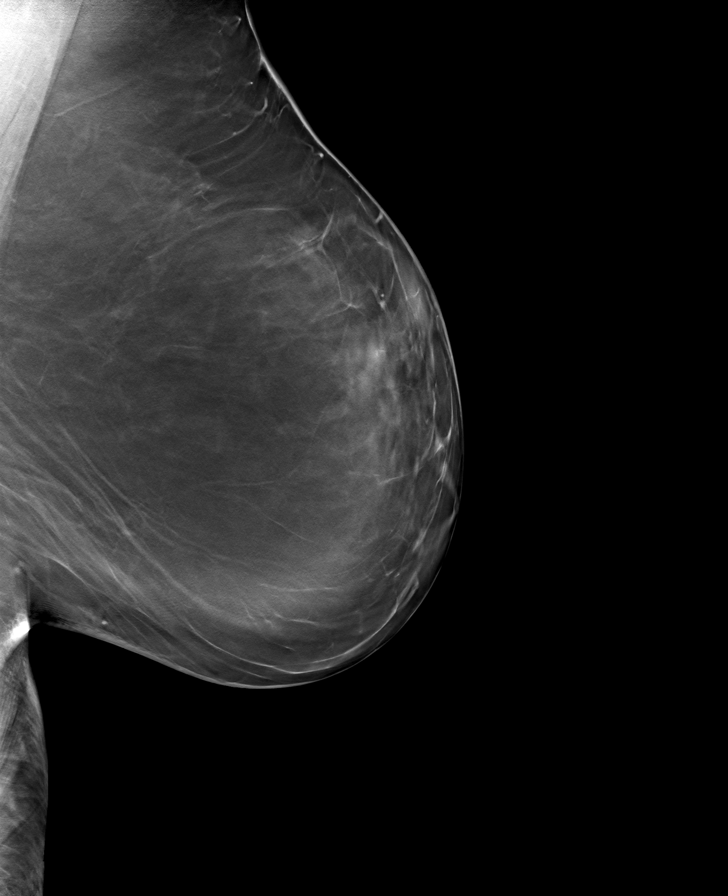

[L CC tomo · tomo slice 55/109.0]
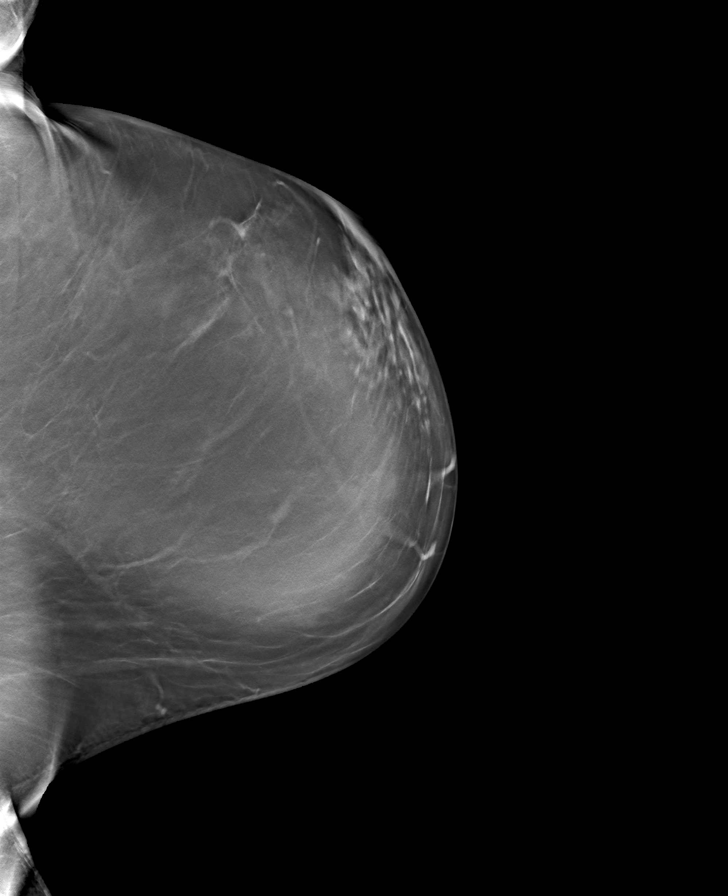

[R CC tomo · tomo slice 43/84.0]
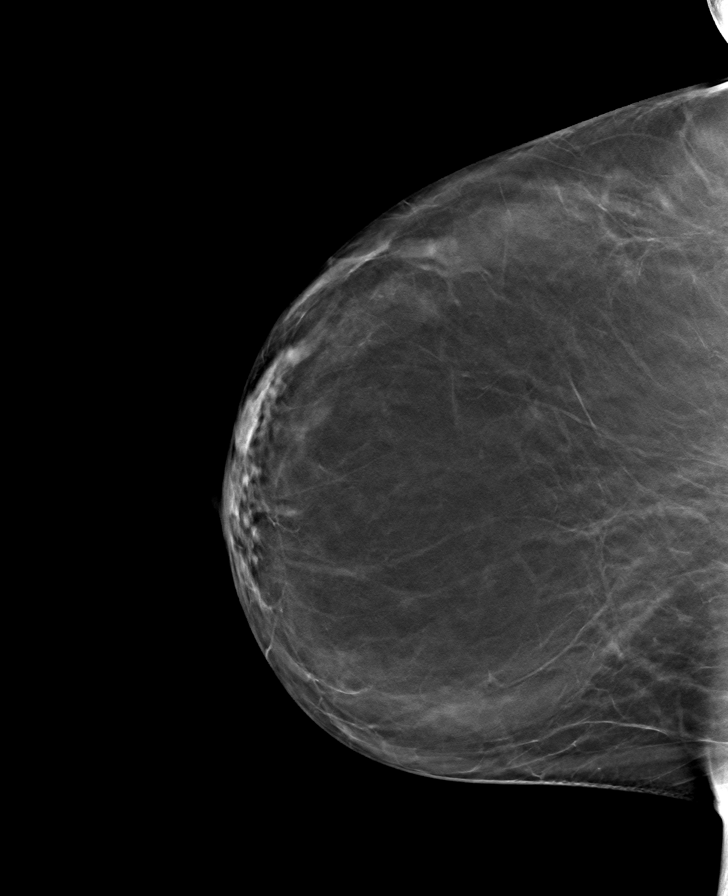

[8 of 24 positions shown; findings below may reference images not displayed]

ACR Breast Density Category b: There are scattered areas of
fibroglandular density.
FINDINGS: There are no findings suspicious for malignancy.
IMPRESSION: No mammographic evidence of malignancy. A result letter of this
screening mammogram will be mailed directly to the patient.

RECOMMENDATION:
Screening mammogram in one year. (Code:[id number])

BI-RADS CATEGORY  1: Negative.

## 2024-01-14 ENCOUNTER — Ambulatory Visit
Admission: RE | Admit: 2024-01-14 | Discharge: 2024-01-14 | Disposition: A | Discharge status: Home / Self Care | Source: Ambulatory Visit

## 2024-01-14 VITALS — BP 136/77 | HR 72 | Temp 98.0°F | Resp 17

## 2024-01-14 DIAGNOSIS — R0789 Other chest pain: Secondary | ICD-10-CM

## 2024-01-14 DIAGNOSIS — R002 Palpitations: Secondary | ICD-10-CM | POA: Diagnosis not present

## 2024-01-14 DIAGNOSIS — S39012A Strain of muscle, fascia and tendon of lower back, initial encounter: Secondary | ICD-10-CM | POA: Diagnosis not present

## 2024-01-14 MED ORDER — DICLOFENAC SODIUM 50 MG PO TBEC
50.0000 mg | DELAYED_RELEASE_TABLET | Freq: Two times a day (BID) | ORAL | 1 refills | Status: AC
Start: 2024-01-14 — End: ?

## 2024-01-14 MED ORDER — BACLOFEN 10 MG PO TABS: 10.0000 mg | ORAL_TABLET | Freq: Three times a day (TID) | ORAL | 0 refills | Status: AC

## 2024-01-14 NOTE — Discharge Instructions (Addendum)
  1. Back strain, initial encounter (Primary) - diclofenac (VOLTAREN) 50 MG EC tablet; Take 1 tablet (50 mg total) by mouth 2 (two) times daily.  Dispense: 30 tablet; Refill: 1 - baclofen (LIORESAL) 10 MG tablet; Take 1 tablet (10 mg total) by mouth 3 (three) times daily.  Dispense: 30 each; Refill: 0 - Follow-up with PCP as scheduled for ongoing evaluation as well as management of back pain if symptoms do not improve with current therapy.  2. Palpitations 3. Acute chest wall pain - ED EKG completed in UC shows normal sinus rhythm with a ventricular rate of 70 bpm, normal EKG, low voltage QRS, no STEMI. - Recommend follow-up with cardiology and PCP as discussed for further evaluation and management of chronic medical conditions and current symptoms. - Discussed with patient that due to history of previous pulmonary embolism and atrial fibrillation, ongoing treatment with anticoagulation therapy is recommended.  Patient states that she discontinued medication herself because she did not like the idea of being on medication long-term.  Patient was strongly recommended to follow-up in emergency department if symptoms continue and especially if symptoms worsen, for further evaluation and treatment.  -Continue to monitor symptoms for any change in severity if there is any escalation of current symptoms or development of new symptoms follow-up in ER for further evaluation and management.

## 2024-01-14 NOTE — ED Triage Notes (Addendum)
 Pt has hx of a-fib and PE. Pt states a few months ago she hit back on table. [place] back from [place] on oct 26th and began to have worsening back pain and chest pain, slight cough, intermittent rapid heart rate, vertigo, and acid reflux.

## 2024-01-14 NOTE — ED Provider Notes (Signed)
 [facility]  Note:  This document was prepared using Dragon voice recognition software and may include unintentional dictation errors.  MRN: [id number] DOB: 04-07-1960  Subjective:   Robin Landry is a 63 y.o. female presenting for evaluation of worsening back pain, chest pain, mild cough, intermittent palpitations, vertigo and acid reflux symptoms.  Patient reports that a few months ago she had her back on a table but denies any significant injury or pain at that time.  Patient now states that since driving back from [place] on October 26 she has had worsening back pain chest pain palpitations vertigo and acid reflux with mild cough.  Patient has past history of atrial fibrillation, obstructive sleep apnea, and pulmonary embolism.  Patient is currently not taking any anticoagulant because she stopped taking medication because she did not want to take medication long-term.  Patient states that she has a cardiologist has not seen them in a year and a half.  Patient does not currently have a primary care provider.  Patient was unaware that she would have to be on anticoagulation therapy for the rest of her life due to history of A-fib and PE.  Patient denies any significant chest pain, palpitations, back pain at rest but states when she is walking around or standing she has increased pain.  No current facility-administered medications for this encounter.  Current Outpatient Medications:    baclofen (LIORESAL) 10 MG tablet, Take 1 tablet (10 mg total) by mouth 3 (three) times daily., Disp: 30 each, Rfl: 0   diclofenac (VOLTAREN) 50 MG EC tablet, Take 1 tablet (50 mg total) by mouth 2 (two) times daily., Disp: 30 tablet, Rfl: 1   ascorbic acid (VITAMIN C) 1000 MG tablet, Take 1,000 mg by mouth daily., Disp: , Rfl:    MAGNESIUM OXIDE PO, Take by mouth., Disp: , Rfl:    Zinc 50 MG TABS, Take by mouth., Disp: , Rfl:    Allergies  Allergen Reactions   Ivp Dye [Iodinated  Contrast Media] Nausea And Vomiting    Past Medical History:  Diagnosis Date   Atrial fibrillation (HCC)    COVID-19    August, hospitalized   History of pulmonary embolus (PE)    OSA (obstructive sleep apnea) 07/07/2020   Pre-diabetes    Pulmonary embolism (HCC)      Past Surgical History:  Procedure Laterality Date   ABDOMINAL HYSTERECTOMY      Family History  Problem Relation Age of Onset   Venous thrombosis Neg Hx    Breast cancer Neg Hx    Colon polyps Neg Hx    Rectal cancer Neg Hx    Esophageal cancer Neg Hx     Social History   Tobacco Use   Smoking status: Former    Current packs/day: 0.00    Average packs/day: 0.5 packs/day for 15.0 years (7.5 ttl pk-yrs)    Types: Cigarettes    Start date: 12/18/1978    Quit date: 12/17/1993    Years since quitting: 30.0   Smokeless tobacco: Never  Vaping Use   Vaping status: Never Used  Substance Use Topics   Alcohol use: Yes    Comment: sometimes   Drug use: Never    ROS Refer to HPI for ROS details.  Objective:   Vitals: BP 136/77 (BP Location: Right Arm)   Pulse 72   Temp 98 F (36.7 C) (Oral)   Resp 17   SpO2 97%   Physical Exam Vitals and nursing note reviewed.  Constitutional:      General: She is not in acute distress.    Appearance: Normal appearance. She is well-developed. She is not ill-appearing or toxic-appearing.  HENT:     Head: Normocephalic and atraumatic.     Mouth/Throat:     Mouth: Mucous membranes are moist.  Cardiovascular:     Rate and Rhythm: Normal rate.  Pulmonary:     Effort: Pulmonary effort is normal. No respiratory distress.  Musculoskeletal:        General: Normal range of motion.  Skin:    General: Skin is warm and dry.  Neurological:     General: No focal deficit present.     Mental Status: She is alert and oriented to person, place, and time.  Psychiatric:        Mood and Affect: Mood normal.        Behavior: Behavior normal.     Procedures  No results  found for this or any previous visit (from the past 24 hours).  No results found.   Assessment and Plan :     Discharge Instructions       1. Back strain, initial encounter (Primary) - diclofenac (VOLTAREN) 50 MG EC tablet; Take 1 tablet (50 mg total) by mouth 2 (two) times daily.  Dispense: 30 tablet; Refill: 1 - baclofen (LIORESAL) 10 MG tablet; Take 1 tablet (10 mg total) by mouth 3 (three) times daily.  Dispense: 30 each; Refill: 0 - Follow-up with PCP as scheduled for ongoing evaluation as well as management of back pain if symptoms do not improve with current therapy.  2. Palpitations 3. Acute chest wall pain - ED EKG completed in UC shows normal sinus rhythm with a ventricular rate of 70 bpm, normal EKG, low voltage QRS, no STEMI. - Recommend follow-up with cardiology and PCP as discussed for further evaluation and management of chronic medical conditions and current symptoms. - Discussed with patient that due to history of previous pulmonary embolism and atrial fibrillation, ongoing treatment with anticoagulation therapy is recommended.  Patient states that she discontinued medication herself because she did not like the idea of being on medication long-term.  Patient was strongly recommended to follow-up in emergency department if symptoms continue and especially if symptoms worsen, for further evaluation and treatment.  -Continue to monitor symptoms for any change in severity if there is any escalation of current symptoms or development of new symptoms follow-up in ER for further evaluation and management.      Danyla Wattley B Zayvon Alicea   Ashanta Amoroso, [place], [place] 01/14/24 1756

## 2024-01-15 ENCOUNTER — Encounter: Payer: Self-pay | Admitting: Family

## 2024-01-15 ENCOUNTER — Ambulatory Visit (INDEPENDENT_AMBULATORY_CARE_PROVIDER_SITE_OTHER): Admitting: Family

## 2024-01-15 ENCOUNTER — Ambulatory Visit (INDEPENDENT_AMBULATORY_CARE_PROVIDER_SITE_OTHER)

## 2024-01-15 VITALS — BP 125/80 | HR 64 | Temp 98.2°F | Resp 16 | Ht 64.0 in | Wt 283.0 lb

## 2024-01-15 DIAGNOSIS — K219 Gastro-esophageal reflux disease without esophagitis: Secondary | ICD-10-CM

## 2024-01-15 DIAGNOSIS — Z7689 Persons encountering health services in other specified circumstances: Secondary | ICD-10-CM

## 2024-01-15 DIAGNOSIS — S39012D Strain of muscle, fascia and tendon of lower back, subsequent encounter: Secondary | ICD-10-CM

## 2024-01-15 DIAGNOSIS — M549 Dorsalgia, unspecified: Secondary | ICD-10-CM | POA: Diagnosis not present

## 2024-01-15 DIAGNOSIS — F418 Other specified anxiety disorders: Secondary | ICD-10-CM | POA: Diagnosis not present

## 2024-01-15 DIAGNOSIS — Z8679 Personal history of other diseases of the circulatory system: Secondary | ICD-10-CM | POA: Diagnosis not present

## 2024-01-15 DIAGNOSIS — F419 Anxiety disorder, unspecified: Secondary | ICD-10-CM

## 2024-01-15 DIAGNOSIS — R002 Palpitations: Secondary | ICD-10-CM | POA: Diagnosis not present

## 2024-01-15 DIAGNOSIS — R0989 Other specified symptoms and signs involving the circulatory and respiratory systems: Secondary | ICD-10-CM

## 2024-01-15 DIAGNOSIS — R0789 Other chest pain: Secondary | ICD-10-CM

## 2024-01-15 DIAGNOSIS — R42 Dizziness and giddiness: Secondary | ICD-10-CM

## 2024-01-15 DIAGNOSIS — Z86711 Personal history of pulmonary embolism: Secondary | ICD-10-CM | POA: Diagnosis not present

## 2024-01-15 LAB — POCT RAPID STREP A (OFFICE): Rapid Strep A Screen: NEGATIVE

## 2024-01-15 MED ORDER — MECLIZINE HCL 12.5 MG PO TABS: 12.5000 mg | ORAL_TABLET | Freq: Three times a day (TID) | ORAL | 0 refills | Status: AC | PRN

## 2024-01-15 NOTE — Progress Notes (Signed)
 Follow up from urgent care, multiple issues

## 2024-01-15 NOTE — Progress Notes (Signed)
 Subjective:    Robin Landry - 63 y.o. female MRN [id number]  Date of birth: 1960-10-12  HPI  Robin Landry is to establish care.   Current issues and/or concerns: - Patient recently seen on 01/14/2024 (1 hours) at [facility]) for back strain initial encounter and additional diagnoses. Today patient stable. States she needs referrals to back specialist and heart specialist. She does not complain of red flag symptoms such as but not limited to chest pain, shortness of breath, worst headache of life, nausea/vomiting.  - Anxiety depression. She denies thoughts of self-harm, suicidal ideations, homicidal ideations. - Intermittent dizziness. Denies red flag symptoms. - Acid reflux. Denies red flag symptoms. - Cough. Denies red flag symptoms.   ROS per HPI    Health Maintenance:  Health Maintenance Due  Topic Date Due   HIV Screening  Never done   Hepatitis C Screening  Never done   Cervical Cancer Screening (HPV/Pap Cotest)  Never done   Mammogram  01/06/2023     Past Medical History: Patient Active Problem List   Diagnosis Date Noted   Mass of soft tissue of neck 01/29/2022   Low back pain 01/29/2022   Prediabetes 12/28/2021   Morbid obesity (HCC) 11/13/2021   Paroxysmal atrial fibrillation (HCC) 11/17/2020   History of pulmonary embolism 11/17/2020   OSA (obstructive sleep apnea) 07/07/2020      Social History   reports that she quit smoking about 30 years ago. Her smoking use included cigarettes. She started smoking about 45 years ago. She has a 7.5 pack-year smoking history. She has never used smokeless tobacco. She reports current alcohol use. She reports that she does not use drugs.   Family History  family history is not on file.   Medications: reviewed and updated   Objective:   Physical Exam BP 125/80   Pulse 64   Temp 98.2 F (36.8 C) (Oral)   Resp 16   Ht 5' 4 (1.626 m)   Wt 283 lb (128.4 kg)   SpO2 96%    BMI 48.58 kg/m   Physical Exam HENT:     Head: Normocephalic and atraumatic.     Right Ear: Tympanic membrane, ear canal and external ear normal.     Left Ear: Tympanic membrane, ear canal and external ear normal.     Nose: Nose normal.     Mouth/Throat:     Mouth: Mucous membranes are moist.     Pharynx: Oropharynx is clear.  Eyes:     Extraocular Movements: Extraocular movements intact.     Conjunctiva/sclera: Conjunctivae normal.     Pupils: Pupils are equal, round, and reactive to light.  Cardiovascular:     Rate and Rhythm: Normal rate and regular rhythm.     Pulses: Normal pulses.     Heart sounds: Normal heart sounds.  Pulmonary:     Effort: Pulmonary effort is normal.     Breath sounds: Normal breath sounds.  Musculoskeletal:        General: Normal range of motion.     Cervical back: Normal range of motion and neck supple.  Neurological:     General: No focal deficit present.     Mental Status: She is alert and oriented to person, place, and time.  Psychiatric:        Mood and Affect: Mood normal.        Behavior: Behavior normal.     Assessment & Plan:  1. Encounter to establish  care (Primary) - Patient presents today to establish care. During the interim follow-up with primary provider as scheduled.  - Return for annual physical examination, labs, and health maintenance. Arrive fasting meaning having no food for at least 8 hours prior to appointment. You may have only water or black coffee. Please take scheduled medications as normal.  2. Back strain, subsequent encounter 3. Back pain, unspecified back location, unspecified back pain laterality, unspecified chronicity - Patient declined pharmacological therapy.  - DG Thoracic Spine W/Swimmers and DG Lumbar Spine Complete for evaluation.  - Referral to Orthopedic Surgery for evaluation/management.  - Follow-up with primary provider as scheduled. - Ambulatory referral to Orthopedic Surgery - DG Thoracic Spine  W/Swimmers; Future - DG Lumbar Spine Complete; Future  4. Palpitations 5. Acute chest wall pain 6. History of atrial fibrillation - Patient today in office with no cardiopulmonary/acute distress.  - Referral to Cardiology for evaluation/management.  - Follow-up with primary provider as scheduled.  - Ambulatory referral to Cardiology  7. History of pulmonary embolism - Patient today in office with no cardiopulmonary/acute distress.  - Referral to Pulmonology for evaluation/management. - Follow-up with primary provider as scheduled.  - Ambulatory referral to Pulmonology  8. Anxiety and depression - Patient denies thoughts of self-harm, suicidal ideations, homicidal ideations. - Patient declined pharmacological therapy.  - Patient declined referral to Psychiatry.  - Follow-up with primary provider as scheduled.   9. Vertigo - Meclizine as prescribed. Counseled on medication adherence/adverse effects.  - Referral to ENT for evaluation/management.  - Follow-up with primary provider as scheduled.  - Ambulatory referral to ENT - meclizine (ANTIVERT) 12.5 MG tablet; Take 1 tablet (12.5 mg total) by mouth 3 (three) times daily as needed for dizziness.  Dispense: 90 tablet; Refill: 0  10. Gastroesophageal reflux disease, unspecified whether esophagitis present - Patient declined pharmacological therapy.  - Patient declined referral to Gastroenterology.  - Follow-up with primary provider as scheduled.  11. Upper respiratory symptom - Patient today in office with no cardiopulmonary/acute distress.  - Routine screening.  - Follow-up with primary provider as scheduled.  - POCT rapid strep A; Future - Culture, Group A Strep - COVID-19, Flu A+B and RSV - DG Chest 2 View; Future   Patient was given clear instructions to go to Emergency Department or return to medical center if symptoms don't improve, worsen, or new problems develop.The patient verbalized understanding.  I discussed the  assessment and treatment plan with the patient. The patient was provided an opportunity to ask questions and all were answered. The patient agreed with the plan and demonstrated an understanding of the instructions.   The patient was advised to call back or seek an in-person evaluation if the symptoms worsen or if the condition fails to improve as anticipated.    Greig Drones, NP 01/15/2024, 3:18 PM Primary Care at [facility]

## 2024-01-16 ENCOUNTER — Ambulatory Visit: Payer: Self-pay | Admitting: Family

## 2024-01-16 LAB — COVID-19, FLU A+B AND RSV
Influenza A, NAA: NOT DETECTED
Influenza B, NAA: NOT DETECTED
RSV, NAA: NOT DETECTED
SARS-CoV-2, NAA: NOT DETECTED

## 2024-01-18 LAB — CULTURE, GROUP A STREP: Strep A Culture: NEGATIVE

## 2024-01-25 ENCOUNTER — Encounter (INDEPENDENT_AMBULATORY_CARE_PROVIDER_SITE_OTHER): Payer: Self-pay

## 2024-02-05 ENCOUNTER — Other Ambulatory Visit: Payer: Self-pay | Admitting: Physical Medicine and Rehabilitation

## 2024-02-05 ENCOUNTER — Encounter: Payer: Self-pay | Admitting: Physical Medicine and Rehabilitation

## 2024-02-05 ENCOUNTER — Ambulatory Visit: Admitting: Physical Medicine and Rehabilitation

## 2024-02-05 DIAGNOSIS — G8929 Other chronic pain: Secondary | ICD-10-CM | POA: Diagnosis not present

## 2024-02-05 DIAGNOSIS — M7918 Myalgia, other site: Secondary | ICD-10-CM

## 2024-02-05 DIAGNOSIS — M546 Pain in thoracic spine: Secondary | ICD-10-CM | POA: Diagnosis not present

## 2024-02-05 DIAGNOSIS — M545 Low back pain, unspecified: Secondary | ICD-10-CM
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
## 2024-02-05 NOTE — Progress Notes (Unsigned)
 Pain Scale   Average Pain 5 Patient advising she has chronic lower back pain non radiating.        +Driver, -BT, -Dye Allergies.

## 2024-02-06 ENCOUNTER — Other Ambulatory Visit (HOSPITAL_BASED_OUTPATIENT_CLINIC_OR_DEPARTMENT_OTHER): Payer: Self-pay | Admitting: Family

## 2024-02-06 DIAGNOSIS — Z1231 Encounter for screening mammogram for malignant neoplasm of breast: Secondary | ICD-10-CM

## 2024-02-12 ENCOUNTER — Inpatient Hospital Stay (HOSPITAL_BASED_OUTPATIENT_CLINIC_OR_DEPARTMENT_OTHER): Admission: RE | Admit: 2024-02-12 | Discharge: 2024-02-12 | Attending: Family | Admitting: Family

## 2024-02-12 ENCOUNTER — Encounter (HOSPITAL_BASED_OUTPATIENT_CLINIC_OR_DEPARTMENT_OTHER): Payer: Self-pay

## 2024-02-12 DIAGNOSIS — Z1231 Encounter for screening mammogram for malignant neoplasm of breast: Secondary | ICD-10-CM | POA: Diagnosis present

## 2024-02-15 ENCOUNTER — Ambulatory Visit: Payer: Self-pay | Admitting: Family

## 2024-03-04 ENCOUNTER — Ambulatory Visit: Admitting: Physical Therapy

## 2024-03-04 ENCOUNTER — Ambulatory Visit: Admitting: Pulmonary Disease

## 2024-03-05 ENCOUNTER — Encounter: Admitting: Orthopedic Surgery

## 2024-03-10 DIAGNOSIS — Z0289 Encounter for other administrative examinations: Secondary | ICD-10-CM

## 2024-03-11 ENCOUNTER — Encounter (INDEPENDENT_AMBULATORY_CARE_PROVIDER_SITE_OTHER): Payer: Self-pay | Admitting: Family Medicine

## 2024-03-11 ENCOUNTER — Ambulatory Visit (INDEPENDENT_AMBULATORY_CARE_PROVIDER_SITE_OTHER): Admitting: Family Medicine

## 2024-03-11 VITALS — BP 112/76 | HR 71 | Temp 98.1°F | Ht 63.0 in | Wt 282.0 lb

## 2024-03-11 DIAGNOSIS — E785 Hyperlipidemia, unspecified: Secondary | ICD-10-CM | POA: Diagnosis not present

## 2024-03-11 DIAGNOSIS — Z6841 Body Mass Index (BMI) 40.0 and over, adult: Secondary | ICD-10-CM

## 2024-03-11 DIAGNOSIS — R7303 Prediabetes: Secondary | ICD-10-CM | POA: Diagnosis not present

## 2024-03-11 DIAGNOSIS — G4733 Obstructive sleep apnea (adult) (pediatric): Secondary | ICD-10-CM

## 2024-03-11 DIAGNOSIS — E782 Mixed hyperlipidemia: Secondary | ICD-10-CM

## 2024-03-11 NOTE — Progress Notes (Signed)
 "  Office: [phone]  /  Fax: [phone]  WEIGHT SUMMARY AND BIOMETRICS  Anthropometric Measurements Height: 5' 3 (1.6 m) Weight: 282 lb (127.9 kg) BMI (Calculated): 49.97 Weight at Last Visit: n/a Weight Lost Since Last Visit: n/a Weight Gained Since Last Visit: n/a Starting Weight: n/a Peak Weight: 285 lb   Body Composition  Body Fat %: 48.2 % Fat Mass (lbs): 136.2 lbs Muscle Mass (lbs): 139 lbs Total Body Water (lbs): 96.2 lbs Visceral Fat Rating : 19   Other Clinical Data Fasting: no Labs: no Today's Visit #: Consult Comments: Consult    Chief Complaint: OBESITY   History of Present Illness Robin Landry is a 64 year old female with obesity who presents for a consultation on weight management options.  She has struggled with weight issues throughout her life, with significant weight gain occurring in adulthood despite attempts at various weight loss programs, including Weight Watchers. Her peak weight was 285 pounds, and her current weight is 282 pounds, with a BMI of 49.9 She has considered weight loss surgery, but is interested in trying medications and lifestyle modifications first.  She has a history of prediabetes, obstructive sleep apnea, and hyperlipidemia. Her thyroid  function has been checked with no abnormalities found. She wants to lose weight to improve her quality of life and engage in activities with her nephews and nieces, such as canoeing and traveling.  She is interested in medications like Zepbound and Mounjaro, particularly due to their potential benefits for sleep apnea. She has done research and spoken with others who have had success with these medications. She is motivated to make lifestyle changes and is ready to start a comprehensive weight management program.      PHYSICAL EXAM:  Blood pressure 112/76, pulse 71, temperature 98.1 F (36.7 C), height 5' 3 (1.6 m), weight 282 lb (127.9 kg), SpO2 98%. Body mass index is 49.95  kg/m.  DIAGNOSTIC DATA REVIEWED BY MYSELF TODAY:  BMET    Component Value Date/Time   NA 140 05/18/2021 1132   K 4.6 05/18/2021 1132   CL 106 05/18/2021 1132   CO2 23 05/18/2021 1132   GLUCOSE 154 (H) 05/18/2021 1132   BUN 11 05/18/2021 1132   CREATININE 0.76 05/18/2021 1132   CALCIUM 9.0 05/18/2021 1132   GFRNONAA 75 12/04/2019 1450   GFRAA 87 12/04/2019 1450   Lab Results  Component Value Date   HGBA1C 6.0 (H) 11/15/2021   No results found for: INSULIN Lab Results  Component Value Date   TSH 2.01 11/15/2021   CBC    Component Value Date/Time   WBC CANCELED 05/18/2021 1132   RBC CANCELED 05/18/2021 1132   HGB CANCELED 05/18/2021 1132   HCT CANCELED 05/18/2021 1132   PLT CANCELED 05/18/2021 1132   MCV 83 12/04/2019 1450   MCH 25.9 (L) 12/04/2019 1450   MCHC 31.1 (L) 12/04/2019 1450   RDW 15.7 (H) 12/04/2019 1450   Iron Studies No results found for: IRON, TIBC, FERRITIN, IRONPCTSAT Lipid Panel     Component Value Date/Time   CHOL 201 (H) 05/18/2021 1132   TRIG 128 05/18/2021 1132   HDL 48 05/18/2021 1132   CHOLHDL 4.2 05/18/2021 1132   LDLCALC 130 (H) 05/18/2021 1132   Hepatic Function Panel     Component Value Date/Time   PROT 7.6 12/04/2019 1450   ALBUMIN 4.0 12/04/2019 1450   AST 17 12/04/2019 1450   ALT 13 12/04/2019 1450   ALKPHOS 100 12/04/2019 1450   BILITOT 0.3  12/04/2019 1450      Component Value Date/Time   TSH 2.01 11/15/2021 1003   Nutritional No results found for: VD25OH   Assessment and Plan Assessment & Plan Morbid obesity BMI of 50.1 and a peak weight of 285 pounds. Long-standing weight issues since adulthood. Previous attempts at weight loss through lifestyle modifications and weight loss surgery were unsuccessful. She is motivated to pursue medication-assisted weight loss. Discussed the genetic component of obesity and the body's mechanisms to resist weight loss. Emphasized the need for a comprehensive workup to  identify specific mechanisms affecting her weight and to tailor a nutrition plan. Discussed the role of medications like Zepbound and Mounjaro, with Zepbound being more likely covered by insurance for sleep apnea. Highlighted the importance of not starting medication until after the workup to ensure proper nutrition and avoid malnutrition. - Scheduled fasting workup visit including blood work, EKG, and resting metabolic rate test. - Provided a specific food eating plan for two weeks to gather more information. - Will discuss test results and dietary adherence at follow-up visit. - Will consider medication prescription after workup and dietary plan evaluation.  Prediabetes Present, which is a risk factor for progression to diabetes. Lifestyle modifications are crucial to manage blood sugar levels and prevent progression. - Incorporate lifestyle modifications as part of the weight management plan.  Obstructive sleep apnea Present. Discussed the potential benefits of weight loss medications like Zepbound, which may also aid in managing sleep apnea symptoms. - Will consider Zepbound as part of the weight loss plan, which may also benefit sleep apnea.  Hyperlipidemia Present, which requires management to reduce cardiovascular risk. Lifestyle modifications are essential to improve lipid profiles. - Incorporate lifestyle modifications as part of the weight management plan.      Patients who are on anti-obesity medications are counseled on the importance of maintaining healthy lifestyle habits, including balanced nutrition, regular physical activity, and behavioral modifications,  Medication is an adjunct to, not a replacement for, lifestyle changes and that the long-term success and weight maintenance depend on continued adherence to these strategies.   Robin Landry was informed of the importance of frequent follow up visits to maximize her success with intensive lifestyle modifications for her obesity  and obesity related health conditions as recommended by USPSTF and CMS guidelines Robin Penton, MD   "

## 2024-03-12 ENCOUNTER — Encounter: Admitting: Orthopedic Surgery

## 2024-03-12 ENCOUNTER — Institutional Professional Consult (permissible substitution) (INDEPENDENT_AMBULATORY_CARE_PROVIDER_SITE_OTHER): Admitting: Otolaryngology

## 2024-04-02 ENCOUNTER — Ambulatory Visit (INDEPENDENT_AMBULATORY_CARE_PROVIDER_SITE_OTHER): Admitting: Family Medicine

## 2024-04-02 ENCOUNTER — Encounter (INDEPENDENT_AMBULATORY_CARE_PROVIDER_SITE_OTHER): Payer: Self-pay | Admitting: Family Medicine

## 2024-04-02 VITALS — BP 137/76 | HR 71 | Temp 98.1°F | Ht 63.5 in | Wt 285.0 lb

## 2024-04-02 DIAGNOSIS — Z1331 Encounter for screening for depression: Secondary | ICD-10-CM | POA: Diagnosis not present

## 2024-04-02 DIAGNOSIS — R0609 Other forms of dyspnea: Secondary | ICD-10-CM

## 2024-04-02 DIAGNOSIS — R5383 Other fatigue: Secondary | ICD-10-CM

## 2024-04-02 DIAGNOSIS — R7303 Prediabetes: Secondary | ICD-10-CM

## 2024-04-02 DIAGNOSIS — G4733 Obstructive sleep apnea (adult) (pediatric): Secondary | ICD-10-CM

## 2024-04-02 DIAGNOSIS — E782 Mixed hyperlipidemia: Secondary | ICD-10-CM

## 2024-04-02 DIAGNOSIS — Z6841 Body Mass Index (BMI) 40.0 and over, adult: Secondary | ICD-10-CM

## 2024-04-02 DIAGNOSIS — R0602 Shortness of breath: Secondary | ICD-10-CM

## 2024-04-02 NOTE — Progress Notes (Signed)
 "  Office: [phone]  /  Fax: [phone]  WEIGHT SUMMARY AND BIOMETRICS  Anthropometric Measurements Height: 5' 3.5 (1.613 m) Weight: 285 lb (129.3 kg) BMI (Calculated): 49.69 Starting Weight: 285 lb Peak Weight: 285 lb Waist Measurement : 54 inches   Body Composition  Body Fat %: 48.4 % Fat Mass (lbs): 138 lbs Muscle Mass (lbs): 139.8 lbs Total Body Water (lbs): 97.2 lbs Visceral Fat Rating : 19   Other Clinical Data RMR: 1123 Fasting: yes Labs: yes Today's Visit #: 1 Starting Date: 04/03/23    Chief Complaint: OBESITY    History of Present Illness Robin Landry is a 64 year old female with obesity who presents for a workup and evaluation of treatment options.  She has experienced weight gain since 2000, attributing it to her eating habits and decreased activity level. She has tried various diets, including keto, carnivore, [place], [place], [place], the bj's, and [employer], with the best results from the [employer] due to social support. Her goal is to reach 160 pounds within 18 months.  She has obstructive sleep apnea. She generally gets five to six hours of sleep per night and wakes feeling refreshed. Her Epworth sleepiness score is 4, which is within normal limits.  She has prediabetes. She is currently engaging in light walking for exercise but does not use a fitness tracker.  She experiences fatigue, described as exercise intolerance rather than constant fatigue, and attributes her lower energy levels to her weight. No daytime somnolence is reported.  She reports dyspnea on exertion during activities such as walking uphill but does not experience constant shortness of breath.  Her social history includes living with her 53 year old niece and 86-year-old nephew, who will support her weight loss efforts. She does all the cooking for the household and notes that her snacks are often less ideal as they are geared towards children. She eats  outside the home once or twice a week, sometimes skips lunch, and identifies her worst eating habits as mindless eating and poor snack choices. She does not feel excessive hunger or out of control with her eating and has never been diagnosed with an eating disorder. Her PHQ-9 score is 1, indicating no significant depressive symptoms.  Testing today includes:  Basal Metabolic Rate calculated via Bioimpedance scale 2091 Resting Energy Expenditure measured via Indirect Calorimeter 1123 and is significantly lower  than expected. PHQ-9 score was WNL at 1 Epworth Sleepiness Score was WNL at 4 ECG shows NSR @ 61 BPM, no ST elevation or QT prolongation       PHYSICAL EXAM:  Blood pressure 137/76, pulse 71, temperature 98.1 F (36.7 C), height 5' 3.5 (1.613 m), weight 285 lb (129.3 kg), SpO2 97%. Body mass index is 49.69 kg/m.  DIAGNOSTIC DATA REVIEWED BY MYSELF TODAY:  BMET    Component Value Date/Time   NA 140 05/18/2021 1132   K 4.6 05/18/2021 1132   CL 106 05/18/2021 1132   CO2 23 05/18/2021 1132   GLUCOSE 154 (H) 05/18/2021 1132   BUN 11 05/18/2021 1132   CREATININE 0.76 05/18/2021 1132   CALCIUM 9.0 05/18/2021 1132   GFRNONAA 75 12/04/2019 1450   GFRAA 87 12/04/2019 1450   Lab Results  Component Value Date   HGBA1C 6.0 (H) 11/15/2021   No results found for: INSULIN Lab Results  Component Value Date   TSH 2.01 11/15/2021   CBC    Component Value Date/Time   WBC CANCELED 05/18/2021 1132   RBC  CANCELED 05/18/2021 1132   HGB CANCELED 05/18/2021 1132   HCT CANCELED 05/18/2021 1132   PLT CANCELED 05/18/2021 1132   MCV 83 12/04/2019 1450   MCH 25.9 (L) 12/04/2019 1450   MCHC 31.1 (L) 12/04/2019 1450   RDW 15.7 (H) 12/04/2019 1450   Iron Studies No results found for: IRON, TIBC, FERRITIN, IRONPCTSAT Lipid Panel     Component Value Date/Time   CHOL 201 (H) 05/18/2021 1132   TRIG 128 05/18/2021 1132   HDL 48 05/18/2021 1132   CHOLHDL 4.2 05/18/2021 1132    LDLCALC 130 (H) 05/18/2021 1132   Hepatic Function Panel     Component Value Date/Time   PROT 7.6 12/04/2019 1450   ALBUMIN 4.0 12/04/2019 1450   AST 17 12/04/2019 1450   ALT 13 12/04/2019 1450   ALKPHOS 100 12/04/2019 1450   BILITOT 0.3 12/04/2019 1450      Component Value Date/Time   TSH 2.01 11/15/2021 1003   Nutritional No results found for: VD25OH   Assessment and Plan Assessment & Plan Morbid obesity Low resting metabolic rate. Previous diets have been unsuccessful. Current goal weight is 160 pounds within 18 months. Emphasis on personalized nutrition and lifestyle changes to address metabolic challenges. Medication to be considered after evaluating dietary adherence and test results. Importance of maintaining muscle mass to ensure long-term health and prevent malnutrition. - Initiated personalized eating plan focusing on low carbs and high protein. - Provided grocery list and food scale for portion control. - Scheduled follow-up in two weeks to assess dietary adherence and discuss potential medication options.  Obstructive sleep apnea Affecting weight and vice versa. Current management includes addressing weight loss as part of the treatment plan. - Continue to address weight loss as part of the management plan for obstructive sleep apnea.  Prediabetes Affecting weight and nutrition. Emphasis on dietary changes to manage blood glucose levels and support weight loss. - Continue to address dietary changes to manage prediabetes and support weight loss.  Fatigue and exertional dyspnea Possibly related to weight. Differential includes vitamin deficiencies and other metabolic issues. Plan to investigate further with labs and dietary adjustments. - Ordered labs to investigate potential vitamin deficiencies and other metabolic issues. - Continue to monitor symptoms and adjust treatment plan as needed.      Patients who are on anti-obesity medications are counseled on  the importance of maintaining healthy lifestyle habits, including balanced nutrition, regular physical activity, and behavioral modifications,  Medication is an adjunct to, not a replacement for, lifestyle changes and that the long-term success and weight maintenance depend on continued adherence to these strategies.   Paiten was informed of the importance of frequent follow up visits to maximize her success with intensive lifestyle modifications for her obesity and obesity related health conditions as recommended by USPSTF and CMS guidelines  I personally spent a total of 45 minutes in the care of the patient today including preparing to see the patient, reviewing separately obtained history, performing a medically appropriate evaluation of current problems, placing orders in the EMR, documenting clinical information in the EMR, customized nutritional counseling for their specific health and social needs, independently interpreting results, and explaining the pathophysiology of obesity and how it is significantly more complex than eat less and exercise more.  Louann Penton, MD   "

## 2024-04-03 LAB — COMPREHENSIVE METABOLIC PANEL WITH GFR
ALT: 11 [IU]/L (ref 0–32)
AST: 14 [IU]/L (ref 0–40)
Albumin: 3.8 g/dL — ABNORMAL LOW (ref 3.9–4.9)
Alkaline Phosphatase: 109 [IU]/L (ref 49–135)
BUN/Creatinine Ratio: 12 (ref 12–28)
BUN: 10 mg/dL (ref 8–27)
Bilirubin Total: 0.6 mg/dL (ref 0.0–1.2)
CO2: 24 mmol/L (ref 20–29)
Calcium: 9.2 mg/dL (ref 8.7–10.3)
Chloride: 103 mmol/L (ref 96–106)
Creatinine, Ser: 0.84 mg/dL (ref 0.57–1.00)
Globulin, Total: 3.6 g/dL (ref 1.5–4.5)
Glucose: 103 mg/dL — ABNORMAL HIGH (ref 70–99)
Potassium: 4.4 mmol/L (ref 3.5–5.2)
Sodium: 140 mmol/L (ref 134–144)
Total Protein: 7.4 g/dL (ref 6.0–8.5)
eGFR: 79 mL/min/{1.73_m2}

## 2024-04-03 LAB — CBC WITH DIFFERENTIAL/PLATELET
Basophils Absolute: 0 10*3/uL (ref 0.0–0.2)
Basos: 1 %
EOS (ABSOLUTE): 0.1 10*3/uL (ref 0.0–0.4)
Eos: 2 %
Hematocrit: 38 % (ref 34.0–46.6)
Hemoglobin: 12 g/dL (ref 11.1–15.9)
Immature Grans (Abs): 0 10*3/uL (ref 0.0–0.1)
Immature Granulocytes: 0 %
Lymphocytes Absolute: 1.8 10*3/uL (ref 0.7–3.1)
Lymphs: 27 %
MCH: 26.6 pg (ref 26.6–33.0)
MCHC: 31.6 g/dL (ref 31.5–35.7)
MCV: 84 fL (ref 79–97)
Monocytes Absolute: 0.3 10*3/uL (ref 0.1–0.9)
Monocytes: 4 %
Neutrophils Absolute: 4.5 10*3/uL (ref 1.4–7.0)
Neutrophils: 66 %
Platelets: 246 10*3/uL (ref 150–450)
RBC: 4.51 x10E6/uL (ref 3.77–5.28)
RDW: 14.4 % (ref 11.7–15.4)
WBC: 6.8 10*3/uL (ref 3.4–10.8)

## 2024-04-03 LAB — T4, FREE: Free T4: 1.11 ng/dL (ref 0.82–1.77)

## 2024-04-03 LAB — HEMOGLOBIN A1C
Est. average glucose Bld gHb Est-mCnc: 140 mg/dL
Hgb A1c MFr Bld: 6.5 % — ABNORMAL HIGH (ref 4.8–5.6)

## 2024-04-03 LAB — LIPID PANEL
Chol/HDL Ratio: 4.1 ratio (ref 0.0–4.4)
Cholesterol, Total: 198 mg/dL (ref 100–199)
HDL: 48 mg/dL
LDL Chol Calc (NIH): 124 mg/dL — ABNORMAL HIGH (ref 0–99)
Triglycerides: 148 mg/dL (ref 0–149)
VLDL Cholesterol Cal: 26 mg/dL (ref 5–40)

## 2024-04-03 LAB — VITAMIN D 25 HYDROXY (VIT D DEFICIENCY, FRACTURES): Vit D, 25-Hydroxy: 13.7 ng/mL — ABNORMAL LOW (ref 30.0–100.0)

## 2024-04-03 LAB — FOLATE: Folate: 7.2 ng/mL

## 2024-04-03 LAB — VITAMIN B12: Vitamin B-12: 1156 pg/mL (ref 232–1245)

## 2024-04-03 LAB — T3: T3, Total: 114 ng/dL (ref 71–180)

## 2024-04-03 LAB — INSULIN, RANDOM: INSULIN: 31.9 u[IU]/mL — ABNORMAL HIGH (ref 2.6–24.9)

## 2024-04-03 LAB — TSH: TSH: 2.53 u[IU]/mL (ref 0.450–4.500)

## 2024-04-16 ENCOUNTER — Ambulatory Visit (INDEPENDENT_AMBULATORY_CARE_PROVIDER_SITE_OTHER): Admitting: Family Medicine
